# Patient Record
Sex: Male | Born: 1980
Health system: Southern US, Community
[De-identification: ages and names within clinical notes are randomized; demographics above are authoritative.]

## PROBLEM LIST (undated history)

## (undated) DIAGNOSIS — K50812 Crohn's disease of both small and large intestine with intestinal obstruction: Principal | ICD-10-CM

## (undated) DIAGNOSIS — K219 Gastro-esophageal reflux disease without esophagitis: Secondary | ICD-10-CM

## (undated) DIAGNOSIS — Z87442 Personal history of urinary calculi: Secondary | ICD-10-CM

## (undated) HISTORY — DX: Crohn's disease of both small and large intestine with intestinal obstruction: K50.812

## (undated) HISTORY — PX: HERNIA REPAIR: SHX51

---

## 2012-03-21 DIAGNOSIS — K50812 Crohn's disease of both small and large intestine with intestinal obstruction: Secondary | ICD-10-CM

## 2012-03-21 HISTORY — DX: Crohn's disease of both small and large intestine with intestinal obstruction: K50.812

## 2012-03-21 HISTORY — PX: SMALL INTESTINE SURGERY: SHX150

## 2012-04-04 DIAGNOSIS — K5090 Crohn's disease, unspecified, without complications: Secondary | ICD-10-CM | POA: Insufficient documentation

## 2012-04-04 DIAGNOSIS — K56609 Unspecified intestinal obstruction, unspecified as to partial versus complete obstruction: Secondary | ICD-10-CM | POA: Insufficient documentation

## 2012-04-09 DIAGNOSIS — Z9889 Other specified postprocedural states: Secondary | ICD-10-CM | POA: Insufficient documentation

## 2012-04-09 DIAGNOSIS — K6389 Other specified diseases of intestine: Secondary | ICD-10-CM | POA: Insufficient documentation

## 2012-04-09 DIAGNOSIS — K559 Vascular disorder of intestine, unspecified: Secondary | ICD-10-CM | POA: Insufficient documentation

## 2012-04-09 DIAGNOSIS — Z9049 Acquired absence of other specified parts of digestive tract: Secondary | ICD-10-CM | POA: Insufficient documentation

## 2012-04-22 DIAGNOSIS — S31109A Unspecified open wound of abdominal wall, unspecified quadrant without penetration into peritoneal cavity, initial encounter: Secondary | ICD-10-CM | POA: Insufficient documentation

## 2012-04-22 DIAGNOSIS — Z789 Other specified health status: Secondary | ICD-10-CM | POA: Insufficient documentation

## 2012-04-22 DIAGNOSIS — Z934 Other artificial openings of gastrointestinal tract status: Secondary | ICD-10-CM | POA: Insufficient documentation

## 2012-05-09 DIAGNOSIS — K7689 Other specified diseases of liver: Secondary | ICD-10-CM | POA: Insufficient documentation

## 2012-05-10 DIAGNOSIS — K7200 Acute and subacute hepatic failure without coma: Secondary | ICD-10-CM | POA: Insufficient documentation

## 2012-05-16 DIAGNOSIS — Z432 Encounter for attention to ileostomy: Secondary | ICD-10-CM | POA: Insufficient documentation

## 2012-06-07 ENCOUNTER — Encounter: Payer: Self-pay | Admitting: Internal Medicine

## 2012-06-07 ENCOUNTER — Ambulatory Visit (INDEPENDENT_AMBULATORY_CARE_PROVIDER_SITE_OTHER): Payer: BC Managed Care – PPO | Admitting: Internal Medicine

## 2012-06-07 VITALS — BP 120/68 | HR 88 | Temp 98.3°F | Resp 16 | Ht 70.0 in | Wt 135.0 lb

## 2012-06-07 DIAGNOSIS — K50812 Crohn's disease of both small and large intestine with intestinal obstruction: Secondary | ICD-10-CM

## 2012-06-07 DIAGNOSIS — K5080 Crohn's disease of both small and large intestine without complications: Secondary | ICD-10-CM

## 2012-06-07 NOTE — Patient Instructions (Signed)
Crohn's Disease Crohn's disease is a long-term (chronic) soreness and redness (inflammation) of the intestines (bowel). It can affect any portion of the digestive tract, from the mouth to the anus. It can also cause problems outside the digestive tract. Crohn's disease is closely related to a disease called ulcerative colitis (together, these two diseases are called inflammatory bowel disease).  CAUSES  The cause of Crohn's disease is not known. One Link Snuffer is that, in an easily affected (susceptible) person, the immune system is triggered to attack the body's own digestive tissue. Crohn's disease runs in families. It seems to be more common in certain geographic areas and amongst certain races. There are no clear-cut dietary causes.  SYMPTOMS  Crohn's disease can cause many different symptoms since it can affect many different parts of the body. Symptoms include:  Fatigue.  Weight loss.  Chronic diarrhea, sometime bloody.  Abdominal pain and cramps.  Fever.  Ulcers or canker sores in the mouth or rectum.  Anemia (low red blood cells).  Arthritis, skin problems, and eye problems may occur. Complications of Crohn's disease can include:  Series of holes (perforation) of the bowel.  Portions of the intestines sticking to each other (adhesions).  Obstruction of the bowel.  Fistula formation, typically in the rectal area but also in other areas. A fistula is an opening between the bowels and the outside, or between the bowels and another organ.  A painful crack in the mucous membrane of the anus (rectal fissure). DIAGNOSIS  Your caregiver may suspect Crohn's disease based on your symptoms and an exam. Blood tests may confirm that there is a problem. You may be asked to submit a stool specimen for examination. X-rays and CT scans may be necessary. Ultimately, the diagnosis is usually made after a procedure that uses a flexible tube that is inserted via your mouth or your anus. This is done  under sedation and is called either an upper endoscopy or colonoscopy. With these tests, the specialist can take tiny tissue samples and remove them from the inside of the bowel (biopsy). Examination of this biopsy tissue under a microscope can reveal Crohn's disease as the cause of your symptoms. Due to the many different forms that Crohn's disease can take, symptoms may be present for several years before a diagnosis is made. HOME CARE INSTRUCTIONS   There is no cure for Crohn's disease. The best treatment is frequent checkups with your caregiver.  Symptoms such as diarrhea can be controlled with medications. Avoid foods that have a laxative effect such as fresh fruit, vegetables and dairy products. During flare ups, you can rest your bowel by refraining from solid foods. Drink clear liquids frequently during the day (electrolyte or re-hydrating fluids are best. Your caregiver can help you with suggestions). Drink often to prevent loss of body fluids (dehydration). When diarrhea has cleared, eat small meals and more frequently. Avoid food additives and stimulants such as caffeine (coffee, tea, or chocolate). Enzyme supplements may help if you develop intolerance to a sugar in dairy products (lactose). Ask your caregiver or dietitian about specific dietary instructions.  Try to maintain a positive attitude. Learn relaxation techniques such as self hypnosis, mental imaging, and muscle relaxation.  If possible, avoid stresses which can aggravate your condition.  Exercise regularly.  Follow your diet.  Always get plenty of rest. SEEK MEDICAL CARE IF:   Your symptoms fail to improve after a week or two of new treatment.  You experience continued weight loss.  You have  ongoing crampy digestion or loose bowels.  You develop a new skin rash, skin sores, or eye problems. SEEK IMMEDIATE MEDICAL CARE IF:   You have worsening of your symptoms or develop new symptoms.  You have a fever.  You  develop bloody diarrhea.  You develop severe abdominal pain. MAKE SURE YOU:   Understand these instructions.  Will watch your condition.  Will get help right away if you are not doing well or get worse. Document Released: 12/15/2004 Document Revised: 05/30/2011 Document Reviewed: 11/13/2006 [facility] Patient Information 2013 [employer].

## 2012-06-08 ENCOUNTER — Telehealth: Payer: Self-pay

## 2012-06-08 ENCOUNTER — Encounter: Payer: Self-pay | Admitting: Internal Medicine

## 2012-06-08 DIAGNOSIS — K50812 Crohn's disease of both small and large intestine with intestinal obstruction: Secondary | ICD-10-CM | POA: Insufficient documentation

## 2012-06-08 NOTE — Telephone Encounter (Signed)
Returned call to Cecille Rubin //[facility] advising ok per MD

## 2012-06-08 NOTE — Assessment & Plan Note (Signed)
I will sign off on IVF and TPN orders and will review his labs as needed

## 2012-06-08 NOTE — Telephone Encounter (Signed)
yes

## 2012-06-08 NOTE — Progress Notes (Signed)
  Subjective:    Patient ID: Jeffrey Reilly, male    DOB: May 19, 1980, 32 y.o.   MRN: [id number]  HPI  New to me. He needs a PCP to sign off on TPN and IVF fluid orders from a pharmacy ([facility]) in [place]. He had a large portion of sm bowel removed 2 months ago in [place] after he suddenly developed an obstruction from crohn's. He now has an ileostomy bag, LUE catheter, and catheter in right lower abd. He feels like he is improving and has no new or worsening complaints today.  Review of Systems  Constitutional: Positive for fatigue. Negative for fever, chills, diaphoresis, activity change, appetite change and unexpected weight change.  HENT: Negative.   Eyes: Negative.   Respiratory: Negative.  Negative for cough, chest tightness, shortness of breath, wheezing and stridor.   Cardiovascular: Negative.   Gastrointestinal: Positive for diarrhea. Negative for nausea, vomiting, abdominal pain, constipation, blood in stool, abdominal distention and rectal pain.  Endocrine: Negative.   Genitourinary: Negative.   Musculoskeletal: Negative for myalgias, back pain, joint swelling, arthralgias and gait problem.  Allergic/Immunologic: Negative.   Neurological: Negative.  Negative for dizziness, tremors, seizures, weakness, light-headedness and numbness.  Hematological: Negative for adenopathy. Does not bruise/bleed easily.  Psychiatric/Behavioral: Negative.        Objective:   Physical Exam  Vitals reviewed. Constitutional: He is oriented to person, place, and time. Vital signs are normal. He appears well-developed and well-nourished.  Non-toxic appearance. He has a sickly appearance. He appears ill (thin, frail). No distress.  HENT:  Head: Normocephalic and atraumatic.  Mouth/Throat: Oropharynx is clear and moist. No oropharyngeal exudate.  Eyes: Conjunctivae are normal. Right eye exhibits no discharge. Left eye exhibits no discharge. No scleral icterus.  Neck: Normal range of motion. Neck supple. No JVD  present. No tracheal deviation present. No thyromegaly present.  Cardiovascular: Normal rate, regular rhythm, normal heart sounds and intact distal pulses.  Exam reveals no gallop and no friction rub.   No murmur heard. Pulmonary/Chest: Effort normal and breath sounds normal. No stridor. No respiratory distress. He has no wheezes. He has no rales. He exhibits no tenderness.  Abdominal: Soft. Normal appearance and bowel sounds are normal. He exhibits no shifting dullness, no distension, no pulsatile liver, no fluid wave, no abdominal bruit, no ascites, no pulsatile midline mass and no mass. There is no hepatosplenomegaly. There is no tenderness. There is no rebound, no guarding and no CVA tenderness. No hernia. Hernia confirmed negative in the ventral area.    Musculoskeletal: Normal range of motion. He exhibits no edema and no tenderness.       Left elbow: He exhibits deformity. He exhibits normal range of motion, no swelling, no effusion and no laceration. No tenderness found.       Arms: Lymphadenopathy:    He has no cervical adenopathy.  Neurological: He is oriented to person, place, and time.  Skin: Skin is warm and dry. No rash noted. He is not diaphoretic. No erythema. No pallor.  Psychiatric: He has a normal mood and affect. His behavior is normal. Judgment and thought content normal.          Assessment & Plan:

## 2012-06-08 NOTE — Telephone Encounter (Signed)
Cecille Rubin with [facility] called confirming that faxed orders were received. She also states that pt has additional order for 1 liter of fluid daily to avoid dehydration. She would like to know if MD would continue those orders. Thanks

## 2012-06-13 ENCOUNTER — Telehealth: Payer: Self-pay

## 2012-06-13 NOTE — Telephone Encounter (Signed)
Yes, that is fine. 

## 2012-06-13 NOTE — Telephone Encounter (Signed)
Levada Dy called [facility] stating that she and the patient discussed "the removal of famotidine from his TPN". They feel that he will be ok as far as acid reflux due to taking protonix but wanted to see how Dr. Ronnald Ramp felt about this. Please advise Thanks

## 2012-06-14 NOTE — Telephone Encounter (Signed)
Levada Dy notified per MD

## 2012-06-18 ENCOUNTER — Telehealth: Payer: Self-pay | Admitting: Internal Medicine

## 2012-06-18 NOTE — Telephone Encounter (Signed)
Rec'd from [facility] forward 73 pages to [place]

## 2012-06-29 ENCOUNTER — Encounter: Payer: Self-pay | Admitting: Internal Medicine

## 2012-07-02 ENCOUNTER — Other Ambulatory Visit: Payer: Self-pay | Admitting: Internal Medicine

## 2012-07-06 ENCOUNTER — Telehealth: Payer: Self-pay | Admitting: Internal Medicine

## 2012-07-06 DIAGNOSIS — R739 Hyperglycemia, unspecified: Secondary | ICD-10-CM

## 2012-07-06 MED ORDER — INSULIN REGULAR HUMAN 100 UNIT/ML IJ SOLN: INTRAMUSCULAR | Status: DC

## 2012-07-06 NOTE — Telephone Encounter (Signed)
Patient has question regarding Insulin and advised that he may need RX.  Please call him back at [phone].

## 2012-07-06 NOTE — Telephone Encounter (Signed)
Pt is currently taking Novolin R on sliding scale for non-fasting sugar above 150-200 2 units 201-250 4 units etc. Rx was prescribed by [previous PCP but medication is expired, new Rx approved bt [facility], pt advised of same.

## 2012-07-09 ENCOUNTER — Telehealth: Payer: Self-pay

## 2012-07-09 NOTE — Telephone Encounter (Signed)
yes

## 2012-07-09 NOTE — Telephone Encounter (Signed)
Levada Dy with [facility] [phone] called requesting to change pt's daily IV fluid to 500 ml from 1000 ml and to Carnitine to pt's TPN. Please advise.

## 2012-07-10 NOTE — Telephone Encounter (Signed)
[place] notified.

## 2012-07-23 ENCOUNTER — Telehealth: Payer: Self-pay | Admitting: *Deleted

## 2012-07-23 ENCOUNTER — Other Ambulatory Visit: Payer: Self-pay | Admitting: *Deleted

## 2012-07-23 MED ORDER — LOPERAMIDE HCL 2 MG PO CAPS: 2.0000 mg | ORAL_CAPSULE | Freq: Three times a day (TID) | ORAL | Status: DC

## 2012-07-23 MED ORDER — PANTOPRAZOLE SODIUM 40 MG PO TBEC: 40.0000 mg | DELAYED_RELEASE_TABLET | Freq: Every day | ORAL | Status: DC

## 2012-07-23 NOTE — Telephone Encounter (Signed)
Pt left vm req Rf on Pantoprazole and Loperamide. Rfs sent. Pt informed

## 2012-07-25 ENCOUNTER — Telehealth: Payer: Self-pay

## 2012-07-25 NOTE — Telephone Encounter (Signed)
yes

## 2012-07-25 NOTE — Telephone Encounter (Signed)
Levada Dy given verbal ok per MD

## 2012-07-25 NOTE — Telephone Encounter (Signed)
Levada Dy with [facility] called stating that pt has gained 24lb over the last six weeks. They would like to know if MD would agree to decrease dextrose to 202 grams, lipids to 40 grams and change infusion time from 12 to 10 hours. Please advise Thanks

## 2012-07-25 NOTE — Telephone Encounter (Signed)
Opened in error

## 2012-08-08 ENCOUNTER — Encounter: Payer: Self-pay | Admitting: Internal Medicine

## 2012-08-09 ENCOUNTER — Encounter: Payer: Self-pay | Admitting: Internal Medicine

## 2012-08-09 ENCOUNTER — Ambulatory Visit (INDEPENDENT_AMBULATORY_CARE_PROVIDER_SITE_OTHER): Payer: BC Managed Care – PPO | Admitting: Internal Medicine

## 2012-08-09 VITALS — BP 120/76 | HR 75 | Temp 98.7°F | Resp 16 | Ht 70.0 in | Wt 153.2 lb

## 2012-08-09 DIAGNOSIS — K5080 Crohn's disease of both small and large intestine without complications: Secondary | ICD-10-CM

## 2012-08-09 DIAGNOSIS — K50812 Crohn's disease of both small and large intestine with intestinal obstruction: Secondary | ICD-10-CM

## 2012-08-12 ENCOUNTER — Encounter: Payer: Self-pay | Admitting: Internal Medicine

## 2012-08-12 NOTE — Assessment & Plan Note (Signed)
Improvement noted I await the results of his recent EGD and upcoming colonoscopy

## 2012-08-12 NOTE — Patient Instructions (Addendum)
Crohn's Disease Crohn's disease is a long-term (chronic) soreness and redness (inflammation) of the intestines (bowel). It can affect any portion of the digestive tract, from the mouth to the anus. It can also cause problems outside the digestive tract. Crohn's disease is closely related to a disease called ulcerative colitis (together, these two diseases are called inflammatory bowel disease).  CAUSES  The cause of Crohn's disease is not known. One Link Snuffer is that, in an easily affected (susceptible) person, the immune system is triggered to attack the body's own digestive tissue. Crohn's disease runs in families. It seems to be more common in certain geographic areas and amongst certain races. There are no clear-cut dietary causes.  SYMPTOMS  Crohn's disease can cause many different symptoms since it can affect many different parts of the body. Symptoms include:  Fatigue.  Weight loss.  Chronic diarrhea, sometime bloody.  Abdominal pain and cramps.  Fever.  Ulcers or canker sores in the mouth or rectum.  Anemia (low red blood cells).  Arthritis, skin problems, and eye problems may occur. Complications of Crohn's disease can include:  Series of holes (perforation) of the bowel.  Portions of the intestines sticking to each other (adhesions).  Obstruction of the bowel.  Fistula formation, typically in the rectal area but also in other areas. A fistula is an opening between the bowels and the outside, or between the bowels and another organ.  A painful crack in the mucous membrane of the anus (rectal fissure). DIAGNOSIS  Your caregiver may suspect Crohn's disease based on your symptoms and an exam. Blood tests may confirm that there is a problem. You may be asked to submit a stool specimen for examination. X-rays and CT scans may be necessary. Ultimately, the diagnosis is usually made after a procedure that uses a flexible tube that is inserted via your mouth or your anus. This is done  under sedation and is called either an upper endoscopy or colonoscopy. With these tests, the specialist can take tiny tissue samples and remove them from the inside of the bowel (biopsy). Examination of this biopsy tissue under a microscope can reveal Crohn's disease as the cause of your symptoms. Due to the many different forms that Crohn's disease can take, symptoms may be present for several years before a diagnosis is made. HOME CARE INSTRUCTIONS   There is no cure for Crohn's disease. The best treatment is frequent checkups with your caregiver.  Symptoms such as diarrhea can be controlled with medications. Avoid foods that have a laxative effect such as fresh fruit, vegetables and dairy products. During flare ups, you can rest your bowel by refraining from solid foods. Drink clear liquids frequently during the day (electrolyte or re-hydrating fluids are best. Your caregiver can help you with suggestions). Drink often to prevent loss of body fluids (dehydration). When diarrhea has cleared, eat small meals and more frequently. Avoid food additives and stimulants such as caffeine (coffee, tea, or chocolate). Enzyme supplements may help if you develop intolerance to a sugar in dairy products (lactose). Ask your caregiver or dietitian about specific dietary instructions.  Try to maintain a positive attitude. Learn relaxation techniques such as self hypnosis, mental imaging, and muscle relaxation.  If possible, avoid stresses which can aggravate your condition.  Exercise regularly.  Follow your diet.  Always get plenty of rest. SEEK MEDICAL CARE IF:   Your symptoms fail to improve after a week or two of new treatment.  You experience continued weight loss.  You have  ongoing crampy digestion or loose bowels.  You develop a new skin rash, skin sores, or eye problems. SEEK IMMEDIATE MEDICAL CARE IF:   You have worsening of your symptoms or develop new symptoms.  You have a fever.  You  develop bloody diarrhea.  You develop severe abdominal pain. MAKE SURE YOU:   Understand these instructions.  Will watch your condition.  Will get help right away if you are not doing well or get worse. Document Released: 12/15/2004 Document Revised: 05/30/2011 Document Reviewed: 11/13/2006 [facility] Patient Information 2014 [place], [place].

## 2012-08-12 NOTE — Progress Notes (Signed)
  Subjective:    Patient ID: Jeffrey Reilly, male    DOB: Dec 20, 1980, 32 y.o.   MRN: [id number]  HPI  He returns for f/up and tells me that his doing much better, he has had a good appetite and has gained some weight. He goes in for a colonoscopy at [facility] tomorrow and he plans to have GI revision surgery in the next few months.  Review of Systems  Constitutional: Negative.  Negative for fever, chills, diaphoresis, activity change, fatigue and unexpected weight change.  HENT: Negative.   Eyes: Negative.   Respiratory: Negative.   Cardiovascular: Negative.   Gastrointestinal: Negative.  Negative for nausea, vomiting, abdominal pain and constipation.  Endocrine: Negative.   Genitourinary: Negative.   Musculoskeletal: Negative.   Skin: Negative.   Allergic/Immunologic: Negative.   Neurological: Negative for dizziness.  Hematological: Negative for adenopathy. Does not bruise/bleed easily.  Psychiatric/Behavioral: Negative.        Objective:   Physical Exam  Vitals reviewed. Constitutional: He is oriented to person, place, and time. He appears well-developed and well-nourished.  Non-toxic appearance. He does not have a sickly appearance. He does not appear ill. No distress.  HENT:  Head: Normocephalic and atraumatic.  Mouth/Throat: Oropharynx is clear and moist. No oropharyngeal exudate.  Eyes: Conjunctivae are normal. Right eye exhibits no discharge. Left eye exhibits no discharge. No scleral icterus.  Neck: Normal range of motion. Neck supple. No JVD present. No tracheal deviation present. No thyromegaly present.  Cardiovascular: Normal rate, regular rhythm, normal heart sounds and intact distal pulses.  Exam reveals no gallop and no friction rub.   No murmur heard. Pulmonary/Chest: Effort normal and breath sounds normal. No stridor. No respiratory distress. He has no wheezes. He has no rales. He exhibits no tenderness.  Abdominal: Soft. Bowel sounds are normal. He exhibits no distension  and no mass. There is no hepatosplenomegaly, splenomegaly or hepatomegaly. There is no tenderness. There is no rebound, no guarding and no CVA tenderness. No hernia. Hernia confirmed negative in the ventral area.    Musculoskeletal: Normal range of motion. He exhibits no edema and no tenderness.  Lymphadenopathy:    He has no cervical adenopathy.  Neurological: He is oriented to person, place, and time.  Skin: Skin is warm and dry. No rash noted. He is not diaphoretic. No erythema. No pallor.  Psychiatric: He has a normal mood and affect. His behavior is normal. Judgment and thought content normal.          Assessment & Plan:

## 2012-08-16 ENCOUNTER — Telehealth: Payer: Self-pay | Admitting: *Deleted

## 2012-08-16 NOTE — Telephone Encounter (Signed)
Left msg on triage requesting call bck with Dr. Ronnald Ramp fax #. Dr. Saint Lucia would like to send Dr. Ronnald Ramp updated information on mutual patient. Called Christine back gave fax #...Jeffrey Reilly

## 2012-08-20 ENCOUNTER — Telehealth: Payer: Self-pay | Admitting: *Deleted

## 2012-08-20 NOTE — Telephone Encounter (Signed)
Pt was seen at [facility] last week and they were suppose to send orders for lab work to PCP. Pt wants to know if those orders were received by [place] and if those orders were faxed to [facility] to have them draw.

## 2012-08-20 NOTE — Telephone Encounter (Signed)
It must have been sent to [facility]

## 2012-08-21 NOTE — Telephone Encounter (Signed)
Left message informing pt of MD's response and to callback office with any questions/concerns.

## 2012-08-23 ENCOUNTER — Telehealth: Payer: Self-pay

## 2012-08-23 DIAGNOSIS — K912 Postsurgical malabsorption, not elsewhere classified: Secondary | ICD-10-CM

## 2012-08-23 NOTE — Telephone Encounter (Signed)
Called [facility], spoke with Levada Dy and gave verbal orders as well as faxed written orders for labs and TPN adjustments to [phone].

## 2012-08-23 NOTE — Telephone Encounter (Signed)
Jeffrey Reilly notified,no receipt of lab request and TPN adjustments. Dr. Saint Reilly is recommending a nutritional panel that includes; vit d 25, copper, folate, ferritin, Iron, TIBC, Selinium, Zinc, vit b6, vit b12, vitamins C,E and A (dx: 579.3).  He also recommends the following " decrease one day per week of TPN and give 1 liter of saline on the day he does not infuse TPN to avoid dehydration. In addition, in light of his excellent hydration status, I believe he could skip one day of his IV hydration 3-4 days separated from the day he skips the TPN."  Orders should be sent to patient home health company. Thanks

## 2012-08-23 NOTE — Telephone Encounter (Signed)
Please send these requests to [facility]

## 2012-08-23 NOTE — Telephone Encounter (Signed)
Kristen w/ Dr. Saint Lucia office at [facility] called [facility] stating that office note from his last visit with Dr. Saint Lucia was faxed over to PCP. The correspondance included his recommendations for TPN adjustment as well as recommended lab orders. Jeffrey Reilly would like to know if MD has received this yet due to no order request received by home health yet.

## 2012-09-03 ENCOUNTER — Encounter: Payer: Self-pay | Admitting: Internal Medicine

## 2012-09-10 ENCOUNTER — Other Ambulatory Visit: Payer: Self-pay | Admitting: *Deleted

## 2012-09-10 NOTE — Telephone Encounter (Signed)
Patient says that he is taking more than written, He is taking 2 tablets three times daily. If you would like him to continue this dose we will need a new script. Thanks!

## 2012-09-11 ENCOUNTER — Telehealth: Payer: Self-pay | Admitting: *Deleted

## 2012-09-11 DIAGNOSIS — K50812 Crohn's disease of both small and large intestine with intestinal obstruction: Secondary | ICD-10-CM

## 2012-09-11 MED ORDER — PANTOPRAZOLE SODIUM 40 MG PO TBEC: 40.0000 mg | DELAYED_RELEASE_TABLET | Freq: Every day | ORAL | Status: DC

## 2012-09-11 MED ORDER — LOPERAMIDE HCL 2 MG PO CAPS: 2.0000 mg | ORAL_CAPSULE | Freq: Three times a day (TID) | ORAL | Status: DC

## 2012-09-11 NOTE — Telephone Encounter (Signed)
Called pharmacy, patient told pharmacy that his pantoprazole has changed to 1 tab 2 x daily and the loperamide has changed to 2 cap 3 x daily. Please advise?

## 2012-09-11 NOTE — Telephone Encounter (Signed)
Duplicate

## 2012-09-11 NOTE — Telephone Encounter (Signed)
Sarah from [facility] called requesting clarification on medications.  Pt states he is to take Pantoprazole 74m 1 tab BID, Loperamide 214mTID.  Rx was not written that way. Please advise SaJudson Rocht [phone]

## 2012-09-12 ENCOUNTER — Telehealth: Payer: Self-pay

## 2012-09-12 DIAGNOSIS — K50812 Crohn's disease of both small and large intestine with intestinal obstruction: Secondary | ICD-10-CM

## 2012-09-12 MED ORDER — PANTOPRAZOLE SODIUM 40 MG PO TBEC: 40.0000 mg | DELAYED_RELEASE_TABLET | Freq: Two times a day (BID) | ORAL | Status: DC

## 2012-09-12 MED ORDER — DIPHENOXYLATE-ATROPINE 2.5-0.025 MG PO TABS: 1.0000 | ORAL_TABLET | Freq: Three times a day (TID) | ORAL | Status: DC | PRN

## 2012-09-12 NOTE — Telephone Encounter (Signed)
I changed it

## 2012-09-12 NOTE — Telephone Encounter (Signed)
Rx changed by Dr Ronnald Ramp.

## 2012-09-12 NOTE — Telephone Encounter (Signed)
Levada Dy, RN from [facility] called requesting the below orders from Dr. Ronnald Ramp. I advised that per MD, those orders will need to come directly from Dr. Saint Lucia office. She states "that they cannot accept orders for another MD who does not sign them, and Dr. Ronnald Ramp has been prescribing previous orders". I will hold note to see if Cyril Mourning calls back

## 2012-09-12 NOTE — Telephone Encounter (Signed)
Received fax from Berton Mount, RN with [facility] stating that Dr. Saint Lucia is recommending a decrease in current TPN orders. Per Dr. Ronnald Ramp advisement, Dr. Saint Lucia will need to contact [facility] directly for the request. Kristen notified via phone message at [phone] and fax at [phone] to advise for their office to contact [facility] directly

## 2012-09-12 NOTE — Telephone Encounter (Signed)
Pt called states he takes Loperamide 31m two tablets TID btu Rx was written to pharmacy for Loperamide 27mone tablet TID.  Please advsie of increase.

## 2012-09-13 ENCOUNTER — Telehealth: Payer: Self-pay

## 2012-09-13 MED ORDER — DIPHENOXYLATE-ATROPINE 2.5-0.025 MG PO TABS: 2.0000 | ORAL_TABLET | Freq: Three times a day (TID) | ORAL | Status: DC | PRN

## 2012-09-13 NOTE — Addendum Note (Signed)
Addended by: Janith Lima on: 09/13/2012 07:25 AM   Modules accepted: Orders

## 2012-09-13 NOTE — Telephone Encounter (Signed)
Levada Dy RN, with [facility] called back this morning requesting to know if MD would be ok with the following since the ordering physician has to also be the signing physician. She will take Dr. Demetra Shiner orders verbally, have a standing verbal order on file from Dr. Ronnald Ramp to

## 2012-09-13 NOTE — Telephone Encounter (Signed)
Levada Dy RN, with [facility] called back this morning requesting to know if MD would be ok with the following since the ordering physician has to also be the signing physician. She will take Dr. Demetra Shiner orders verbally, have a standing verbal order on file from Dr. Ronnald Ramp approving Dr. Saint Lucia recommendations to avoid so many phone calls. She will then fax over the written orders to be signed by Dr. Ronnald Ramp. Please advise if you are in agreement with this. Thanks

## 2012-09-13 NOTE — Telephone Encounter (Signed)
Victoriano Lain at [facility]

## 2012-09-13 NOTE — Telephone Encounter (Signed)
ok 

## 2012-09-14 ENCOUNTER — Encounter: Payer: Self-pay | Admitting: Internal Medicine

## 2012-09-18 ENCOUNTER — Telehealth: Payer: Self-pay | Admitting: *Deleted

## 2012-09-18 ENCOUNTER — Encounter: Payer: Self-pay | Admitting: Internal Medicine

## 2012-09-18 NOTE — Telephone Encounter (Signed)
Rx has been changed as per Dr Ronnald Ramp.

## 2012-09-18 NOTE — Telephone Encounter (Signed)
Pt called requesting an increase of his Loperamide.  He states he takes 2 (54m) tabs TID but his Rx is written for 1(228m tab TID.  Please advise of increase.

## 2012-09-18 NOTE — Telephone Encounter (Signed)
This has been changed

## 2012-09-27 ENCOUNTER — Encounter: Payer: Self-pay | Admitting: Internal Medicine

## 2012-10-08 ENCOUNTER — Encounter: Payer: Self-pay | Admitting: Internal Medicine

## 2012-10-09 ENCOUNTER — Encounter: Payer: Self-pay | Admitting: Internal Medicine

## 2012-10-16 ENCOUNTER — Telehealth: Payer: Self-pay | Admitting: Internal Medicine

## 2012-10-16 NOTE — Telephone Encounter (Signed)
rec'd records from [facility], Forward 4 pages to Dr.Jones

## 2012-10-24 ENCOUNTER — Encounter: Payer: Self-pay | Admitting: Internal Medicine

## 2012-12-12 ENCOUNTER — Encounter: Payer: Self-pay | Admitting: Internal Medicine

## 2012-12-18 ENCOUNTER — Telehealth: Payer: Self-pay | Admitting: *Deleted

## 2012-12-18 DIAGNOSIS — K50812 Crohn's disease of both small and large intestine with intestinal obstruction: Secondary | ICD-10-CM

## 2012-12-18 NOTE — Telephone Encounter (Signed)
Pt called requesting a referral to a local GI MD so he may get an order for Remigade treatments.  Please advise

## 2012-12-18 NOTE — Telephone Encounter (Signed)
ok 

## 2012-12-19 NOTE — Telephone Encounter (Signed)
Spoke with pt advised referral complete

## 2013-01-08 ENCOUNTER — Ambulatory Visit (INDEPENDENT_AMBULATORY_CARE_PROVIDER_SITE_OTHER): Payer: BC Managed Care – PPO | Admitting: Internal Medicine

## 2013-01-08 ENCOUNTER — Encounter: Payer: Self-pay | Admitting: Internal Medicine

## 2013-01-08 VITALS — BP 140/86 | HR 80 | Temp 98.5°F | Resp 16 | Ht 70.0 in | Wt 148.0 lb

## 2013-01-08 DIAGNOSIS — Z79899 Other long term (current) drug therapy: Secondary | ICD-10-CM

## 2013-01-08 DIAGNOSIS — Z79620 Long term (current) use of immunosuppressive biologic: Secondary | ICD-10-CM | POA: Insufficient documentation

## 2013-01-08 DIAGNOSIS — K5080 Crohn's disease of both small and large intestine without complications: Secondary | ICD-10-CM

## 2013-01-08 DIAGNOSIS — K50812 Crohn's disease of both small and large intestine with intestinal obstruction: Secondary | ICD-10-CM

## 2013-01-08 DIAGNOSIS — Z23 Encounter for immunization: Secondary | ICD-10-CM | POA: Insufficient documentation

## 2013-01-08 MED ORDER — INFLIXIMAB 100 MG IV SOLR: 5.0000 mg/kg | INTRAVENOUS | Status: DC

## 2013-01-08 NOTE — Assessment & Plan Note (Addendum)
Will place a PPD today At the request of his GI at [facility] he will start remicade (66m/kg at time 0, 2 weeks, 6 weeks, then every 8 weeks) He will cont TPN

## 2013-01-08 NOTE — Patient Instructions (Signed)
Crohn's Disease Crohn's disease is a long-term (chronic) soreness and redness (inflammation) of the intestines (bowel). It can affect any portion of the digestive tract, from the mouth to the anus. It can also cause problems outside the digestive tract. Crohn's disease is closely related to a disease called ulcerative colitis (together, these two diseases are called inflammatory bowel disease).  CAUSES  The cause of Crohn's disease is not known. One Link Snuffer is that, in an easily affected (susceptible) person, the immune system is triggered to attack the body's own digestive tissue. Crohn's disease runs in families. It seems to be more common in certain geographic areas and amongst certain races. There are no clear-cut dietary causes.  SYMPTOMS  Crohn's disease can cause many different symptoms since it can affect many different parts of the body. Symptoms include:  Fatigue.  Weight loss.  Chronic diarrhea, sometime bloody.  Abdominal pain and cramps.  Fever.  Ulcers or canker sores in the mouth or rectum.  Anemia (low red blood cells).  Arthritis, skin problems, and eye problems may occur. Complications of Crohn's disease can include:  Series of holes (perforation) of the bowel.  Portions of the intestines sticking to each other (adhesions).  Obstruction of the bowel.  Fistula formation, typically in the rectal area but also in other areas. A fistula is an opening between the bowels and the outside, or between the bowels and another organ.  A painful crack in the mucous membrane of the anus (rectal fissure). DIAGNOSIS  Your caregiver may suspect Crohn's disease based on your symptoms and an exam. Blood tests may confirm that there is a problem. You may be asked to submit a stool specimen for examination. X-rays and CT scans may be necessary. Ultimately, the diagnosis is usually made after a procedure that uses a flexible tube that is inserted via your mouth or your anus. This is done  under sedation and is called either an upper endoscopy or colonoscopy. With these tests, the specialist can take tiny tissue samples and remove them from the inside of the bowel (biopsy). Examination of this biopsy tissue under a microscope can reveal Crohn's disease as the cause of your symptoms. Due to the many different forms that Crohn's disease can take, symptoms may be present for several years before a diagnosis is made. HOME CARE INSTRUCTIONS   There is no cure for Crohn's disease. The best treatment is frequent checkups with your caregiver.  Symptoms such as diarrhea can be controlled with medications. Avoid foods that have a laxative effect such as fresh fruit, vegetables and dairy products. During flare ups, you can rest your bowel by refraining from solid foods. Drink clear liquids frequently during the day (electrolyte or re-hydrating fluids are best. Your caregiver can help you with suggestions). Drink often to prevent loss of body fluids (dehydration). When diarrhea has cleared, eat small meals and more frequently. Avoid food additives and stimulants such as caffeine (coffee, tea, or chocolate). Enzyme supplements may help if you develop intolerance to a sugar in dairy products (lactose). Ask your caregiver or dietitian about specific dietary instructions.  Try to maintain a positive attitude. Learn relaxation techniques such as self hypnosis, mental imaging, and muscle relaxation.  If possible, avoid stresses which can aggravate your condition.  Exercise regularly.  Follow your diet.  Always get plenty of rest. SEEK MEDICAL CARE IF:   Your symptoms fail to improve after a week or two of new treatment.  You experience continued weight loss.  You have  ongoing crampy digestion or loose bowels.  You develop a new skin rash, skin sores, or eye problems. SEEK IMMEDIATE MEDICAL CARE IF:   You have worsening of your symptoms or develop new symptoms.  You have a fever.  You  develop bloody diarrhea.  You develop severe abdominal pain. MAKE SURE YOU:   Understand these instructions.  Will watch your condition.  Will get help right away if you are not doing well or get worse. Document Released: 12/15/2004 Document Revised: 05/30/2011 Document Reviewed: 11/13/2006 [facility] Patient Information 2014 [place], [place].

## 2013-01-08 NOTE — Progress Notes (Signed)
  Subjective:    Patient ID: Jeffrey Reilly, male    DOB: 12-07-1980, 32 y.o.   MRN: [id number]  HPI  He returns for f/up, he has had extensive surgery at [facility]. He is still on TPN 5 days per week but is eating. He has about 3 formed BM's per day and he is gaining weight. His GI doctor at [facility] has asked that we start remicade in infusion here. I did not have success with a local GI doctor being willing to see him.  Review of Systems  Constitutional: Positive for unexpected weight change (some weight gain). Negative for fever, chills, diaphoresis, appetite change and fatigue.  HENT: Negative.   Eyes: Negative.   Respiratory: Negative.  Negative for cough, chest tightness, shortness of breath, wheezing and stridor.   Cardiovascular: Negative.  Negative for chest pain, palpitations and leg swelling.  Gastrointestinal: Negative.  Negative for nausea, vomiting, abdominal pain, diarrhea and constipation.  Endocrine: Negative.   Genitourinary: Negative.  Negative for dysuria, frequency and difficulty urinating.  Musculoskeletal: Negative.   Skin: Negative.   Allergic/Immunologic: Negative.   Neurological: Negative.  Negative for dizziness, tremors, speech difficulty, weakness and light-headedness.  Hematological: Negative.  Negative for adenopathy. Does not bruise/bleed easily.  Psychiatric/Behavioral: Negative.        Objective:   Physical Exam  Vitals reviewed. Constitutional: He is oriented to person, place, and time. He appears well-developed and well-nourished. No distress.  HENT:  Head: Normocephalic and atraumatic.  Mouth/Throat: Oropharynx is clear and moist. No oropharyngeal exudate.  Eyes: Conjunctivae are normal. Right eye exhibits no discharge. Left eye exhibits no discharge. No scleral icterus.  Neck: Normal range of motion. Neck supple. No JVD present. No tracheal deviation present. No thyromegaly present.  Cardiovascular: Normal rate, regular rhythm, normal heart sounds and intact  distal pulses.  Exam reveals no gallop and no friction rub.   No murmur heard. Pulmonary/Chest: Effort normal and breath sounds normal. No stridor. No respiratory distress. He has no wheezes. He has no rales. He exhibits no tenderness.  Abdominal: Soft. Bowel sounds are normal. He exhibits no distension and no mass. There is no tenderness. There is no rebound and no guarding.  Musculoskeletal: Normal range of motion. He exhibits no edema and no tenderness.  Lymphadenopathy:    He has no cervical adenopathy.  Neurological: He is oriented to person, place, and time.  Skin: Skin is warm and dry. No rash noted. He is not diaphoretic. No erythema. No pallor.  Psychiatric: He has a normal mood and affect. His behavior is normal. Judgment and thought content normal.      No results found for this basename: WBC, HGB, HCT, PLT, GLUCOSE, CHOL, TRIG, HDL, LDLDIRECT, LDLCALC, ALT, AST, NA, K, CL, CREATININE, BUN, CO2, TSH, PSA, INR, GLUF, HGBA1C, MICROALBUR      Assessment & Plan:

## 2013-01-08 NOTE — Assessment & Plan Note (Signed)
I will check his PPD

## 2013-01-10 LAB — TB SKIN TEST
Induration: 0 mm
TB Skin Test: NEGATIVE

## 2013-01-14 ENCOUNTER — Other Ambulatory Visit (HOSPITAL_COMMUNITY): Payer: Self-pay | Admitting: *Deleted

## 2013-01-15 ENCOUNTER — Encounter (HOSPITAL_COMMUNITY)
Admission: RE | Admit: 2013-01-15 | Discharge: 2013-01-15 | Disposition: A | Discharge status: Home / Self Care | Payer: BC Managed Care – PPO | Source: Ambulatory Visit | Attending: Internal Medicine | Admitting: Internal Medicine

## 2013-01-15 ENCOUNTER — Telehealth: Payer: Self-pay

## 2013-01-15 DIAGNOSIS — K50812 Crohn's disease of both small and large intestine with intestinal obstruction: Secondary | ICD-10-CM

## 2013-01-15 DIAGNOSIS — K5080 Crohn's disease of both small and large intestine without complications: Secondary | ICD-10-CM | POA: Insufficient documentation

## 2013-01-15 MED ORDER — INFLIXIMAB 100 MG IV SOLR: INTRAVENOUS | Status: DC

## 2013-01-15 MED ORDER — HEPARIN SOD (PORK) LOCK FLUSH 100 UNIT/ML IV SOLN
250.0000 [IU] | INTRAVENOUS | Status: DC | PRN
  Administered 2013-01-15: 250 [IU]

## 2013-01-15 MED ORDER — SODIUM CHLORIDE 0.9 % IV SOLN
INTRAVENOUS | Status: DC
  Administered 2013-01-15: 10:00:00 via INTRAVENOUS

## 2013-01-15 MED ORDER — HEPARIN SOD (PORK) LOCK FLUSH 100 UNIT/ML IV SOLN
INTRAVENOUS | Status: AC
  Filled 2013-01-15: qty 5

## 2013-01-15 MED ORDER — SODIUM CHLORIDE 0.9 % IV SOLN
300.0000 mg | INTRAVENOUS | Status: DC
  Administered 2013-01-15: 300 mg via INTRAVENOUS
  Filled 2013-01-15: qty 30

## 2013-01-15 NOTE — Telephone Encounter (Signed)
Verbal orders given to Jeffrey Reilly for the following- Inject 300 mg at 0 wk, 2 wk, 6 wk, and 8 wk intervals  To be infused at [facility] for benadryl 50 mg and tylenol 500 mg prior to injection

## 2013-01-15 NOTE — Telephone Encounter (Signed)
Izora Gala from [facility] short stay called stating that pt is there for infusion today and would like to clarify his Remicade orders. Per Izora Gala, the MD at [facility] as well as the standard for this infusion is usually 2/2/6/8 wk intervals but the order that they received has every 14 days. She would just like to clarify which it should be? Thanks

## 2013-01-15 NOTE — Telephone Encounter (Signed)
Go by the [facility] order

## 2013-01-29 ENCOUNTER — Encounter (HOSPITAL_COMMUNITY)
Admission: RE | Admit: 2013-01-29 | Discharge: 2013-01-29 | Disposition: A | Discharge status: Home / Self Care | Payer: BC Managed Care – PPO | Source: Ambulatory Visit | Attending: Internal Medicine | Admitting: Internal Medicine

## 2013-01-29 DIAGNOSIS — K5080 Crohn's disease of both small and large intestine without complications: Secondary | ICD-10-CM | POA: Insufficient documentation

## 2013-01-29 MED ORDER — HEPARIN SOD (PORK) LOCK FLUSH 100 UNIT/ML IV SOLN
250.0000 [IU] | INTRAVENOUS | Status: DC | PRN
  Administered 2013-01-29: 250 [IU]

## 2013-01-29 MED ORDER — HEPARIN SOD (PORK) LOCK FLUSH 100 UNIT/ML IV SOLN
INTRAVENOUS | Status: AC
  Administered 2013-01-29: 250 [IU]
  Filled 2013-01-29: qty 5

## 2013-01-29 MED ORDER — SODIUM CHLORIDE 0.9 % IV SOLN
300.0000 mg | INTRAVENOUS | Status: DC
  Administered 2013-01-29: 300 mg via INTRAVENOUS
  Filled 2013-01-29: qty 30

## 2013-01-29 MED ORDER — SODIUM CHLORIDE 0.9 % IV SOLN
INTRAVENOUS | Status: DC
  Administered 2013-01-29: 10:00:00 via INTRAVENOUS

## 2013-01-29 MED ORDER — SODIUM CHLORIDE 0.9 % IV SOLN
300.0000 mg | INTRAVENOUS | Status: DC
  Filled 2013-01-29: qty 30

## 2013-02-13 DIAGNOSIS — Z7960 Long term (current) use of unspecified immunomodulators and immunosuppressants: Secondary | ICD-10-CM | POA: Insufficient documentation

## 2013-02-25 ENCOUNTER — Telehealth: Payer: Self-pay

## 2013-02-25 NOTE — Telephone Encounter (Signed)
Patient has been advised

## 2013-02-25 NOTE — Telephone Encounter (Signed)
Phone call from patient [phone] stating he has a remicade infusion tomorrow. He feels a cold starting. No fever but he does have head congestion. He was advised to give his pcp a call to see if okay to still have the infusion. Please advise, thanks

## 2013-02-25 NOTE — Telephone Encounter (Signed)
Yes, as long as he is not ill with fever, cough, muscle aches, etc.

## 2013-02-26 ENCOUNTER — Encounter (HOSPITAL_COMMUNITY)
Admission: RE | Admit: 2013-02-26 | Discharge: 2013-02-26 | Disposition: A | Discharge status: Home / Self Care | Payer: BC Managed Care – PPO | Source: Ambulatory Visit | Attending: Internal Medicine | Admitting: Internal Medicine

## 2013-02-26 DIAGNOSIS — K5080 Crohn's disease of both small and large intestine without complications: Secondary | ICD-10-CM | POA: Insufficient documentation

## 2013-02-26 MED ORDER — HEPARIN SOD (PORK) LOCK FLUSH 100 UNIT/ML IV SOLN
250.0000 [IU] | INTRAVENOUS | Status: AC | PRN
  Administered 2013-02-26: 250 [IU]

## 2013-02-26 MED ORDER — SODIUM CHLORIDE 0.9 % IV SOLN: 300.0000 mg | INTRAVENOUS | Status: DC

## 2013-02-26 MED ORDER — SODIUM CHLORIDE 0.9 % IV SOLN
300.0000 mg | Freq: Once | INTRAVENOUS | Status: AC
  Administered 2013-02-26: 300 mg via INTRAVENOUS
  Filled 2013-02-26: qty 30

## 2013-02-26 MED ORDER — SODIUM CHLORIDE 0.9 % IV SOLN
INTRAVENOUS | Status: DC
  Administered 2013-02-26: 11:00:00 via INTRAVENOUS

## 2013-02-26 MED ORDER — HEPARIN SOD (PORK) LOCK FLUSH 100 UNIT/ML IV SOLN
INTRAVENOUS | Status: AC
  Filled 2013-02-26: qty 5

## 2013-07-25 ENCOUNTER — Telehealth: Payer: Self-pay | Admitting: *Deleted

## 2013-07-25 NOTE — Telephone Encounter (Signed)
Unsure if this is a medication provided by the hospital for infusion or if pt personally picks up from the pharmacy.

## 2013-07-25 NOTE — Telephone Encounter (Signed)
Will attempt to initiate PA, unsure if this can be back dated.

## 2013-07-25 NOTE — Telephone Encounter (Signed)
Pt called requesting PA be processed for Remicade.  He also requesting the PA be back dated as his last infusion was denied.  Please advise

## 2013-07-26 NOTE — Telephone Encounter (Signed)
Called pt and informed that the hosiptal in responsible for the PA. There is no indication the MD prescribed the rx infusion.

## 2013-09-25 DIAGNOSIS — K432 Incisional hernia without obstruction or gangrene: Secondary | ICD-10-CM | POA: Insufficient documentation

## 2013-10-29 ENCOUNTER — Other Ambulatory Visit (HOSPITAL_COMMUNITY): Payer: Self-pay | Admitting: *Deleted

## 2013-10-30 ENCOUNTER — Encounter (HOSPITAL_COMMUNITY)
Admission: RE | Admit: 2013-10-30 | Discharge: 2013-10-30 | Disposition: A | Discharge status: Home / Self Care | Payer: BC Managed Care – PPO | Source: Ambulatory Visit | Attending: Pediatrics | Admitting: Pediatrics

## 2013-10-30 DIAGNOSIS — K5090 Crohn's disease, unspecified, without complications: Secondary | ICD-10-CM | POA: Insufficient documentation

## 2013-10-30 MED ORDER — SODIUM CHLORIDE 0.9 % IV SOLN
5.0000 mg/kg | INTRAVENOUS | Status: DC
  Administered 2013-10-30: 300 mg via INTRAVENOUS
  Filled 2013-10-30: qty 30

## 2013-10-30 MED ORDER — SODIUM CHLORIDE 0.9 % IV SOLN
INTRAVENOUS | Status: DC
  Administered 2013-10-30: 09:00:00 via INTRAVENOUS

## 2014-06-17 ENCOUNTER — Other Ambulatory Visit (HOSPITAL_COMMUNITY): Payer: Self-pay | Admitting: *Deleted

## 2014-06-18 ENCOUNTER — Encounter (HOSPITAL_COMMUNITY)
Admission: RE | Admit: 2014-06-18 | Discharge: 2014-06-18 | Disposition: A | Discharge status: Home / Self Care | Payer: BLUE CROSS/BLUE SHIELD | Source: Ambulatory Visit | Attending: Pediatrics | Admitting: Pediatrics

## 2014-06-18 DIAGNOSIS — K5000 Crohn's disease of small intestine without complications: Secondary | ICD-10-CM | POA: Insufficient documentation

## 2014-06-18 MED ORDER — SODIUM CHLORIDE 0.9 % IV SOLN
INTRAVENOUS | Status: DC
  Administered 2014-06-18: 250 mL via INTRAVENOUS

## 2014-06-18 MED ORDER — SODIUM CHLORIDE 0.9 % IV SOLN
5.0000 mg/kg | INTRAVENOUS | Status: DC
  Administered 2014-06-18: 300 mg via INTRAVENOUS
  Filled 2014-06-18: qty 30

## 2014-06-18 NOTE — Progress Notes (Signed)
Entered Fera[facility] discharge instructions in chart by mistake. Feraheme instructions were printed but were NOT given to patient.

## 2014-06-18 NOTE — Discharge Instructions (Signed)

## 2014-08-13 ENCOUNTER — Encounter (HOSPITAL_COMMUNITY)
Admission: RE | Admit: 2014-08-13 | Discharge: 2014-08-13 | Disposition: A | Discharge status: Home / Self Care | Payer: BLUE CROSS/BLUE SHIELD | Source: Ambulatory Visit | Attending: Pediatrics | Admitting: Pediatrics

## 2014-08-13 DIAGNOSIS — K5000 Crohn's disease of small intestine without complications: Secondary | ICD-10-CM | POA: Insufficient documentation

## 2014-08-13 LAB — CBC
HCT: 48.5 % (ref 39.0–52.0)
Hemoglobin: 16.8 g/dL (ref 13.0–17.0)
MCH: 30.8 pg (ref 26.0–34.0)
MCHC: 34.6 g/dL (ref 30.0–36.0)
MCV: 89 fL (ref 78.0–100.0)
Platelets: 167 10*3/uL (ref 150–400)
RBC: 5.45 MIL/uL (ref 4.22–5.81)
RDW: 12.3 % (ref 11.5–15.5)
WBC: 7.4 10*3/uL (ref 4.0–10.5)

## 2014-08-13 LAB — HEPATIC FUNCTION PANEL
ALT: 23 U/L (ref 17–63)
AST: 25 U/L (ref 15–41)
Albumin: 4.3 g/dL (ref 3.5–5.0)
Alkaline Phosphatase: 45 U/L (ref 38–126)
Bilirubin, Direct: 0.3 mg/dL (ref 0.1–0.5)
Indirect Bilirubin: 1.3 mg/dL — ABNORMAL HIGH (ref 0.3–0.9)
Total Bilirubin: 1.6 mg/dL — ABNORMAL HIGH (ref 0.3–1.2)
Total Protein: 7.1 g/dL (ref 6.5–8.1)

## 2014-08-13 LAB — SEDIMENTATION RATE: Sed Rate: 0 mm/hr (ref 0–16)

## 2014-08-13 MED ORDER — SODIUM CHLORIDE 0.9 % IV SOLN
5.0000 mg/kg | INTRAVENOUS | Status: DC
  Administered 2014-08-13: 300 mg via INTRAVENOUS
  Filled 2014-08-13: qty 30

## 2014-08-13 MED ORDER — SODIUM CHLORIDE 0.9 % IV SOLN
INTRAVENOUS | Status: DC
  Administered 2014-08-13: 10:00:00 via INTRAVENOUS

## 2014-10-08 ENCOUNTER — Encounter (HOSPITAL_COMMUNITY)
Admission: RE | Admit: 2014-10-08 | Discharge: 2014-10-08 | Disposition: A | Discharge status: Home / Self Care | Payer: BLUE CROSS/BLUE SHIELD | Source: Ambulatory Visit | Attending: Pediatrics | Admitting: Pediatrics

## 2014-10-08 ENCOUNTER — Encounter (HOSPITAL_COMMUNITY): Payer: BLUE CROSS/BLUE SHIELD

## 2014-10-08 DIAGNOSIS — K5000 Crohn's disease of small intestine without complications: Secondary | ICD-10-CM | POA: Diagnosis not present

## 2014-10-08 MED ORDER — SODIUM CHLORIDE 0.9 % IV SOLN
INTRAVENOUS | Status: DC
  Administered 2014-10-08: 10:00:00 via INTRAVENOUS

## 2014-10-08 MED ORDER — SODIUM CHLORIDE 0.9 % IV SOLN
5.0000 mg/kg | INTRAVENOUS | Status: DC
  Administered 2014-10-08: 300 mg via INTRAVENOUS
  Filled 2014-10-08: qty 30

## 2014-12-04 ENCOUNTER — Encounter (HOSPITAL_COMMUNITY)
Admission: RE | Admit: 2014-12-04 | Discharge: 2014-12-04 | Disposition: A | Discharge status: Home / Self Care | Payer: BLUE CROSS/BLUE SHIELD | Source: Ambulatory Visit | Attending: Pediatrics | Admitting: Pediatrics

## 2014-12-04 DIAGNOSIS — K5000 Crohn's disease of small intestine without complications: Secondary | ICD-10-CM | POA: Diagnosis not present

## 2014-12-04 LAB — HEPATIC FUNCTION PANEL
ALT: 22 U/L (ref 17–63)
AST: 31 U/L (ref 15–41)
Albumin: 4.6 g/dL (ref 3.5–5.0)
Alkaline Phosphatase: 42 U/L (ref 38–126)
Bilirubin, Direct: 0.3 mg/dL (ref 0.1–0.5)
Indirect Bilirubin: 1 mg/dL — ABNORMAL HIGH (ref 0.3–0.9)
Total Bilirubin: 1.3 mg/dL — ABNORMAL HIGH (ref 0.3–1.2)
Total Protein: 6.8 g/dL (ref 6.5–8.1)

## 2014-12-04 LAB — SEDIMENTATION RATE: Sed Rate: 2 mm/hr (ref 0–16)

## 2014-12-04 LAB — CBC
HCT: 49.8 % (ref 39.0–52.0)
Hemoglobin: 17.3 g/dL — ABNORMAL HIGH (ref 13.0–17.0)
MCH: 31.2 pg (ref 26.0–34.0)
MCHC: 34.7 g/dL (ref 30.0–36.0)
MCV: 89.7 fL (ref 78.0–100.0)
Platelets: 152 10*3/uL (ref 150–400)
RBC: 5.55 MIL/uL (ref 4.22–5.81)
RDW: 12.2 % (ref 11.5–15.5)
WBC: 7.1 10*3/uL (ref 4.0–10.5)

## 2014-12-04 LAB — C-REACTIVE PROTEIN: CRP: <0.5 mg/dL (ref <1.0)

## 2014-12-04 MED ORDER — SODIUM CHLORIDE 0.9 % IV SOLN: INTRAVENOUS | Status: DC

## 2014-12-04 MED ORDER — SODIUM CHLORIDE 0.9 % IV SOLN: 5.0000 mg/kg | INTRAVENOUS | Status: DC

## 2014-12-04 MED ORDER — SODIUM CHLORIDE 0.9 % IV SOLN
INTRAVENOUS | Status: DC
  Administered 2014-12-04: 10:00:00 via INTRAVENOUS

## 2014-12-04 MED ORDER — SODIUM CHLORIDE 0.9 % IV SOLN
5.0000 mg/kg | INTRAVENOUS | Status: DC
  Administered 2014-12-04: 300 mg via INTRAVENOUS
  Filled 2014-12-04: qty 30

## 2014-12-31 ENCOUNTER — Ambulatory Visit (INDEPENDENT_AMBULATORY_CARE_PROVIDER_SITE_OTHER): Payer: BLUE CROSS/BLUE SHIELD | Admitting: Internal Medicine

## 2014-12-31 ENCOUNTER — Encounter: Payer: Self-pay | Admitting: Internal Medicine

## 2014-12-31 ENCOUNTER — Ambulatory Visit (INDEPENDENT_AMBULATORY_CARE_PROVIDER_SITE_OTHER)
Admission: RE | Admit: 2014-12-31 | Discharge: 2014-12-31 | Disposition: A | Discharge status: Home / Self Care | Payer: BLUE CROSS/BLUE SHIELD | Source: Ambulatory Visit | Attending: Internal Medicine | Admitting: Internal Medicine

## 2014-12-31 VITALS — BP 140/90 | HR 72 | Temp 98.3°F | Resp 12 | Ht 70.0 in | Wt 155.0 lb

## 2014-12-31 DIAGNOSIS — Z23 Encounter for immunization: Secondary | ICD-10-CM

## 2014-12-31 DIAGNOSIS — R05 Cough: Secondary | ICD-10-CM

## 2014-12-31 DIAGNOSIS — J069 Acute upper respiratory infection, unspecified: Secondary | ICD-10-CM

## 2014-12-31 DIAGNOSIS — R059 Cough, unspecified: Secondary | ICD-10-CM | POA: Insufficient documentation

## 2014-12-31 DIAGNOSIS — J209 Acute bronchitis, unspecified: Secondary | ICD-10-CM | POA: Insufficient documentation

## 2014-12-31 DIAGNOSIS — B9789 Other viral agents as the cause of diseases classified elsewhere: Secondary | ICD-10-CM

## 2014-12-31 MED ORDER — HYDROCODONE-GUAIFENESIN 2.5-200 MG/5ML PO SOLN: 5.0000 mL | Freq: Four times a day (QID) | ORAL | Status: DC | PRN

## 2014-12-31 NOTE — Patient Instructions (Signed)

## 2014-12-31 NOTE — Progress Notes (Signed)
Subjective:  Patient ID: Jeffrey Reilly, male    DOB: [birth year]  Age: 34 y.o. MRN: [id number]  CC: Cough   HPI Jeffrey Reilly presents for a 3 week hx of intermittent NP cough with a "tickle" sensation in his throat. The cough is most prominent at night.  Outpatient Prescriptions Prior to Visit  Medication Sig Dispense Refill  . inFLIXimab (REMICADE) 100 MG injection Inject 300 mg at 0 wk, 2 wk, 6 wk, and 8 wk intervals  To be infused at [facility] for benadryl 50 mg and tylenol 500 mg prior to injection 1 each 1  . Probiotic Product (PROBIOTIC DAILY PO) Take by mouth. 2 caps daily    . IRON, FERROUS GLUCONATE, PO Take by mouth. 9 drops (liquid) BID    . predniSONE (DELTASONE) 10 MG tablet Take 10 mg by mouth. taper     No facility-administered medications prior to visit.    ROS Review of Systems  Constitutional: Negative.  Negative for fever, chills, diaphoresis, activity change, appetite change, fatigue and unexpected weight change.  HENT: Negative.  Negative for congestion, facial swelling, sinus pressure, sneezing, sore throat, tinnitus, trouble swallowing and voice change.   Eyes: Negative.   Respiratory: Positive for cough. Negative for choking, chest tightness, shortness of breath, wheezing and stridor.   Cardiovascular: Negative.  Negative for chest pain, palpitations and leg swelling.  Gastrointestinal: Negative.  Negative for nausea, vomiting, diarrhea, constipation and blood in stool.  Endocrine: Negative.   Genitourinary: Negative.   Musculoskeletal: Negative.   Skin: Negative.  Negative for rash.  Allergic/Immunologic: Positive for immunocompromised state.  Neurological: Negative.   Hematological: Negative.   Psychiatric/Behavioral: Negative.     Objective:  BP 140/90 mmHg  Pulse 72  Temp(Src) 98.3 F (36.8 C) (Oral)  Resp 12  Ht 5' 10"  (1.778 m)  Wt 155 lb (70.308 kg)  BMI 22.24 kg/m2  SpO2 97%  BP Readings from Last 3 Encounters:  12/31/14  140/90  12/04/14 117/73  10/08/14 114/66    Wt Readings from Last 3 Encounters:  12/31/14 155 lb (70.308 kg)  12/04/14 147 lb 14.9 oz (67.1 kg)  10/08/14 148 lb (67.132 kg)    Physical Exam  Constitutional: He is oriented to person, place, and time. He appears well-developed and well-nourished.  Non-toxic appearance. He does not have a sickly appearance. He does not appear ill. No distress.  HENT:  Mouth/Throat: Oropharynx is clear and moist. No oropharyngeal exudate.  Eyes: Conjunctivae are normal. Right eye exhibits no discharge. Left eye exhibits no discharge. No scleral icterus.  Neck: Normal range of motion. Neck supple. No JVD present. No tracheal deviation present. No thyromegaly present.  Cardiovascular: Normal rate, regular rhythm, normal heart sounds and intact distal pulses.  Exam reveals no gallop and no friction rub.   No murmur heard. Pulmonary/Chest: Effort normal and breath sounds normal. No stridor. No respiratory distress. He has no wheezes. He has no rales. He exhibits no tenderness.  Abdominal: Soft. Bowel sounds are normal. He exhibits no distension and no mass. There is no tenderness. There is no rebound and no guarding.  Musculoskeletal: Normal range of motion. He exhibits no edema or tenderness.  Lymphadenopathy:    He has no cervical adenopathy.  Neurological: He is oriented to person, place, and time.  Skin: Skin is warm and dry. No rash noted. He is not diaphoretic. No erythema. No pallor.  Psychiatric: He has a normal mood and affect. His behavior is normal.  Judgment and thought content normal.  Vitals reviewed.   Lab Results  Component Value Date   WBC 7.1 12/04/2014   HGB 17.3* 12/04/2014   HCT 49.8 12/04/2014   PLT 152 12/04/2014   ALT 22 12/04/2014   AST 31 12/04/2014    No results found.  Assessment & Plan:   Recardo was seen today for cough.  Diagnoses and all orders for this visit:  Cough- exam and CXR are normal. Will treat for Viral  URI. -     DG Chest 2 View; Future -     HYDROcodone-GuaiFENesin (FLOWTUSS) 2.5-200 MG/5ML SOLN; Take 5 mLs by mouth 4 (four) times daily as needed.  Need for influenza vaccination -     Flu Vaccine QUAD 36+ mos IM  Need for Tdap vaccination -     Tdap vaccine greater than or equal to 7yo IM  Viral URI with cough -     HYDROcodone-GuaiFENesin (FLOWTUSS) 2.5-200 MG/5ML SOLN; Take 5 mLs by mouth 4 (four) times daily as needed.  I have discontinued Mr. Dunson predniSONE and (IRON, FERROUS GLUCONATE, PO). I am also having him start on HYDROcodone-GuaiFENesin. Additionally, I am having him maintain his Probiotic Product (PROBIOTIC DAILY PO) and inFLIXimab.  Meds ordered this encounter  Medications  . HYDROcodone-GuaiFENesin (FLOWTUSS) 2.5-200 MG/5ML SOLN    Sig: Take 5 mLs by mouth 4 (four) times daily as needed.    Dispense:  118 mL    Refill:  0     Follow-up: Return in about 3 weeks (around 01/21/2015).  Scarlette Calico, MD

## 2015-01-01 ENCOUNTER — Encounter: Payer: Self-pay | Admitting: Internal Medicine

## 2015-01-21 ENCOUNTER — Encounter: Payer: Self-pay | Admitting: Internal Medicine

## 2015-01-21 ENCOUNTER — Ambulatory Visit (INDEPENDENT_AMBULATORY_CARE_PROVIDER_SITE_OTHER): Payer: BLUE CROSS/BLUE SHIELD | Admitting: Internal Medicine

## 2015-01-21 ENCOUNTER — Ambulatory Visit (INDEPENDENT_AMBULATORY_CARE_PROVIDER_SITE_OTHER)
Admission: RE | Admit: 2015-01-21 | Discharge: 2015-01-21 | Disposition: A | Discharge status: Home / Self Care | Payer: BLUE CROSS/BLUE SHIELD | Source: Ambulatory Visit | Attending: Internal Medicine | Admitting: Internal Medicine

## 2015-01-21 VITALS — BP 112/78 | HR 81 | Temp 98.5°F | Resp 16 | Ht 70.0 in | Wt 157.0 lb

## 2015-01-21 DIAGNOSIS — J209 Acute bronchitis, unspecified: Secondary | ICD-10-CM | POA: Diagnosis not present

## 2015-01-21 DIAGNOSIS — R0789 Other chest pain: Secondary | ICD-10-CM

## 2015-01-21 DIAGNOSIS — R059 Cough, unspecified: Secondary | ICD-10-CM

## 2015-01-21 DIAGNOSIS — R05 Cough: Secondary | ICD-10-CM

## 2015-01-21 MED ORDER — HYDROCODONE-HOMATROPINE 5-1.5 MG/5ML PO SYRP
5.0000 mL | ORAL_SOLUTION | Freq: Three times a day (TID) | ORAL | Status: DC | PRN
Start: 2015-01-21 — End: 2015-02-11

## 2015-01-21 MED ORDER — MOXIFLOXACIN HCL 400 MG PO TABS: 400.0000 mg | ORAL_TABLET | Freq: Every day | ORAL | Status: AC

## 2015-01-21 NOTE — Patient Instructions (Signed)

## 2015-01-21 NOTE — Progress Notes (Signed)
Pre visit review using our clinic review tool, if applicable. No additional management support is needed unless otherwise documented below in the visit note. 

## 2015-01-25 ENCOUNTER — Encounter: Payer: Self-pay | Admitting: Internal Medicine

## 2015-01-26 ENCOUNTER — Encounter: Payer: Self-pay | Admitting: Internal Medicine

## 2015-01-26 NOTE — Progress Notes (Signed)
Subjective:  Patient ID: Jeffrey Reilly, male    DOB: [birth year]  Age: 34 y.o. MRN: [id number]  CC: Cough   HPI Samual Beals presents for follow-up on cough. He was last seen 3 weeks ago at which time he had a nonproductive cough. He tells me that now the cough has become productive of thick yellow phlegm in the mornings. He is also coughing so severely he has developed some discomfort on the left side of his rib cage that he describes as a sharp sensation with cough and movement.  Outpatient Prescriptions Prior to Visit  Medication Sig Dispense Refill  . inFLIXimab (REMICADE) 100 MG injection Inject 300 mg at 0 wk, 2 wk, 6 wk, and 8 wk intervals  To be infused at [place] for benadryl 50 mg and tylenol 500 mg prior to injection 1 each 1  . Probiotic Product (PROBIOTIC DAILY PO) Take by mouth. 2 caps daily    . HYDROcodone-GuaiFENesin (FLOWTUSS) 2.5-200 MG/5ML SOLN Take 5 mLs by mouth 4 (four) times daily as needed. (Patient not taking: Reported on 01/21/2015) 118 mL 0   No facility-administered medications prior to visit.    ROS Review of Systems  Constitutional: Negative.  Negative for fever, chills, diaphoresis, activity change, appetite change and fatigue.  HENT: Negative.  Negative for congestion, sinus pressure, sore throat and trouble swallowing.   Eyes: Negative.   Respiratory: Positive for cough. Negative for apnea, choking, chest tightness, shortness of breath, wheezing and stridor.   Cardiovascular: Positive for chest pain. Negative for palpitations and leg swelling.  Gastrointestinal: Negative.  Negative for nausea, vomiting, abdominal pain, diarrhea and constipation.  Endocrine: Negative.   Genitourinary: Negative.   Musculoskeletal: Negative.  Negative for myalgias, back pain, arthralgias and neck pain.  Skin: Negative.  Negative for rash.  Allergic/Immunologic: Negative.   Neurological: Negative.  Negative for dizziness, speech difficulty, weakness,  light-headedness and numbness.  Hematological: Negative.  Negative for adenopathy. Does not bruise/bleed easily.  Psychiatric/Behavioral: Negative.     Objective:  BP 112/78 mmHg  Pulse 81  Temp(Src) 98.5 F (36.9 C) (Oral)  Resp 16  Ht 5' 10"  (1.778 m)  Wt 157 lb (71.215 kg)  BMI 22.53 kg/m2  SpO2 96%  BP Readings from Last 3 Encounters:  01/21/15 112/78  12/31/14 140/90  12/04/14 117/73    Wt Readings from Last 3 Encounters:  01/21/15 157 lb (71.215 kg)  12/31/14 155 lb (70.308 kg)  12/04/14 147 lb 14.9 oz (67.1 kg)    Physical Exam  Constitutional: He is oriented to person, place, and time. He appears well-developed and well-nourished.  Non-toxic appearance. He does not have a sickly appearance. He does not appear ill. No distress.  HENT:  Head: Normocephalic and atraumatic.  Mouth/Throat: Oropharynx is clear and moist. No oropharyngeal exudate.  Eyes: Conjunctivae are normal. Right eye exhibits no discharge. Left eye exhibits no discharge. No scleral icterus.  Neck: Normal range of motion. Neck supple. No JVD present. No tracheal deviation present. No thyromegaly present.  Cardiovascular: Normal rate, regular rhythm, normal heart sounds and intact distal pulses.  Exam reveals no gallop and no friction rub.   No murmur heard. Pulmonary/Chest: Effort normal and breath sounds normal. No accessory muscle usage or stridor. No respiratory distress. He has no decreased breath sounds. He has no wheezes. He has no rhonchi. He has no rales. He exhibits no mass, no tenderness, no bony tenderness, no crepitus, no edema, no deformity, no swelling and no  retraction.  Abdominal: Soft. Bowel sounds are normal. He exhibits no distension and no mass. There is no tenderness. There is no rebound and no guarding.  Musculoskeletal: Normal range of motion. He exhibits no edema or tenderness.  Lymphadenopathy:    He has no cervical adenopathy.  Neurological: He is oriented to person, place, and  time.  Skin: Skin is warm and dry. No rash noted. He is not diaphoretic. No erythema. No pallor.  Vitals reviewed.   Lab Results  Component Value Date   WBC 7.1 12/04/2014   HGB 17.3* 12/04/2014   HCT 49.8 12/04/2014   PLT 152 12/04/2014   ALT 22 12/04/2014   AST 31 12/04/2014    Dg Chest 2 View  01/22/2015  CLINICAL DATA:  Two months of cough chest congestion and left chest wall discomfort. EXAM: CHEST  2 VIEW COMPARISON:  PA and lateral chest x-ray of December 21, 2014 FINDINGS: The lungs are well-expanded. There is no focal infiltrate, pleural effusion, or pneumothorax. There is no abnormal pleural thickening. The heart and pulmonary vascularity are normal. The mediastinum is normal in width. There is no pleural effusion. The bony thorax exhibits no acute abnormality. IMPRESSION: There is no evidence of pneumonia nor other acute cardiopulmonary abnormality. Electronically Signed   By: David  Martinique M.D.   On: 01/22/2015 07:51    Assessment & Plan:   Casen was seen today for cough.  Diagnoses and all orders for this visit:  Acute bronchitis, unspecified organism- will treat the infection with Avelox and will control the cough with Hycodan -     HYDROcodone-homatropine (HYCODAN) 5-1.5 MG/5ML syrup; Take 5 mLs by mouth every 8 (eight) hours as needed for cough. -     moxifloxacin (AVELOX) 400 MG tablet; Take 1 tablet (400 mg total) by mouth daily.  Cough- his chest x-ray is normal, will treat him for acute bronchitis. -     HYDROcodone-homatropine (HYCODAN) 5-1.5 MG/5ML syrup; Take 5 mLs by mouth every 8 (eight) hours as needed for cough. -     DG Chest 2 View; Future  Left-sided chest wall pain- this is consistent with musculoskeletal chest wall pain. Will control the cough and he will try some over-the-counter Tylenol as well. -     DG Chest 2 View; Future   I have discontinued Mr. Ternes HYDROcodone-GuaiFENesin. I am also having him start on HYDROcodone-homatropine and  moxifloxacin. Additionally, I am having him maintain his Probiotic Product (PROBIOTIC DAILY PO) and inFLIXimab.  Meds ordered this encounter  Medications  . HYDROcodone-homatropine (HYCODAN) 5-1.5 MG/5ML syrup    Sig: Take 5 mLs by mouth every 8 (eight) hours as needed for cough.    Dispense:  120 mL    Refill:  0  . moxifloxacin (AVELOX) 400 MG tablet    Sig: Take 1 tablet (400 mg total) by mouth daily.    Dispense:  5 tablet    Refill:  0     Follow-up: Return in about 3 weeks (around 02/11/2015).  Scarlette Calico, MD

## 2015-01-27 ENCOUNTER — Other Ambulatory Visit (HOSPITAL_COMMUNITY): Payer: Self-pay | Admitting: *Deleted

## 2015-01-28 ENCOUNTER — Encounter (HOSPITAL_COMMUNITY)
Admission: RE | Admit: 2015-01-28 | Discharge: 2015-01-28 | Disposition: A | Discharge status: Home / Self Care | Payer: BLUE CROSS/BLUE SHIELD | Source: Ambulatory Visit | Attending: Pediatrics | Admitting: Pediatrics

## 2015-01-28 DIAGNOSIS — K5000 Crohn's disease of small intestine without complications: Secondary | ICD-10-CM | POA: Insufficient documentation

## 2015-02-06 ENCOUNTER — Other Ambulatory Visit (HOSPITAL_COMMUNITY): Payer: Self-pay | Admitting: *Deleted

## 2015-02-09 ENCOUNTER — Encounter (HOSPITAL_COMMUNITY)
Admission: RE | Admit: 2015-02-09 | Discharge: 2015-02-09 | Disposition: A | Discharge status: Home / Self Care | Payer: BLUE CROSS/BLUE SHIELD | Source: Ambulatory Visit | Attending: Pediatrics | Admitting: Pediatrics

## 2015-02-09 DIAGNOSIS — K5000 Crohn's disease of small intestine without complications: Secondary | ICD-10-CM | POA: Diagnosis not present

## 2015-02-09 LAB — RENAL FUNCTION PANEL
Albumin: 4.5 g/dL (ref 3.5–5.0)
Anion gap: 6 (ref 5–15)
BUN: 19 mg/dL (ref 6–20)
CO2: 29 mmol/L (ref 22–32)
Calcium: 9.6 mg/dL (ref 8.9–10.3)
Chloride: 105 mmol/L (ref 101–111)
Creatinine, Ser: 1.2 mg/dL (ref 0.61–1.24)
GFR calc Af Amer: >60 mL/min (ref >60)
GFR calc non Af Amer: >60 mL/min (ref >60)
Glucose, Bld: 100 mg/dL — ABNORMAL HIGH (ref 65–99)
Phosphorus: 2.5 mg/dL (ref 2.5–4.6)
Potassium: 4.4 mmol/L (ref 3.5–5.1)
Sodium: 140 mmol/L (ref 135–145)

## 2015-02-09 LAB — C-REACTIVE PROTEIN: CRP: 1.4 mg/dL — ABNORMAL HIGH (ref <1.0)

## 2015-02-09 MED ORDER — SODIUM CHLORIDE 0.9 % IV SOLN
INTRAVENOUS | Status: DC
  Administered 2015-02-09: 11:00:00 via INTRAVENOUS

## 2015-02-09 MED ORDER — SODIUM CHLORIDE 0.9 % IV SOLN
5.0000 mg/kg | INTRAVENOUS | Status: DC
  Administered 2015-02-09: 400 mg via INTRAVENOUS
  Filled 2015-02-09: qty 40

## 2015-02-11 ENCOUNTER — Encounter: Payer: Self-pay | Admitting: Internal Medicine

## 2015-02-11 ENCOUNTER — Ambulatory Visit (INDEPENDENT_AMBULATORY_CARE_PROVIDER_SITE_OTHER): Payer: BLUE CROSS/BLUE SHIELD | Admitting: Internal Medicine

## 2015-02-11 VITALS — BP 120/78 | HR 74 | Temp 98.4°F | Resp 16 | Ht 70.0 in | Wt 156.0 lb

## 2015-02-11 DIAGNOSIS — J209 Acute bronchitis, unspecified: Secondary | ICD-10-CM | POA: Diagnosis not present

## 2015-02-12 NOTE — Progress Notes (Signed)
Subjective:  Patient ID: Jeffrey Reilly, male    DOB: 05-22-1980  Age: 34 y.o. MRN: [id number]  CC: Cough   HPI Jeffrey Reilly presents for f/up - his cough has resolved, he feels well and offers no complaints.  Outpatient Prescriptions Prior to Visit  Medication Sig Dispense Refill  . inFLIXimab (REMICADE) 100 MG injection Inject 300 mg at 0 wk, 2 wk, 6 wk, and 8 wk intervals  To be infused at [facility] for benadryl 50 mg and tylenol 500 mg prior to injection 1 each 1  . Probiotic Product (PROBIOTIC DAILY PO) Take by mouth. 2 caps daily    . HYDROcodone-homatropine (HYCODAN) 5-1.5 MG/5ML syrup Take 5 mLs by mouth every 8 (eight) hours as needed for cough. 120 mL 0   No facility-administered medications prior to visit.    ROS Review of Systems  Constitutional: Negative.  Negative for fever, chills, diaphoresis and fatigue.  HENT: Negative.  Negative for sore throat.   Eyes: Negative.   Respiratory: Negative.  Negative for cough, choking, chest tightness, shortness of breath and stridor.   Cardiovascular: Negative.  Negative for chest pain, palpitations and leg swelling.  Gastrointestinal: Negative.  Negative for nausea, abdominal pain, diarrhea and blood in stool.  Endocrine: Negative.   Genitourinary: Negative.   Musculoskeletal: Negative.   Skin: Negative.  Negative for rash.  Allergic/Immunologic: Negative.   Neurological: Negative.   Hematological: Negative.   Psychiatric/Behavioral: Negative.     Objective:  BP 120/78 mmHg  Pulse 74  Temp(Src) 98.4 F (36.9 C) (Oral)  Resp 16  Ht 5' 10"  (1.778 m)  Wt 156 lb (70.761 kg)  BMI 22.38 kg/m2  SpO2 97%  BP Readings from Last 3 Encounters:  02/11/15 120/78  02/09/15 123/73  01/28/15 130/76    Wt Readings from Last 3 Encounters:  02/11/15 156 lb (70.761 kg)  02/09/15 154 lb 5.2 oz (70 kg)  01/28/15 154 lb 5.2 oz (70 kg)    Physical Exam  Constitutional: He is oriented to person, place, and time. No  distress.  HENT:  Mouth/Throat: Oropharynx is clear and moist. No oropharyngeal exudate.  Eyes: Conjunctivae are normal. Right eye exhibits no discharge. Left eye exhibits no discharge. No scleral icterus.  Neck: Normal range of motion. Neck supple. No JVD present. No tracheal deviation present. No thyromegaly present.  Cardiovascular: Normal rate, regular rhythm, normal heart sounds and intact distal pulses.  Exam reveals no gallop and no friction rub.   No murmur heard. Pulmonary/Chest: Effort normal and breath sounds normal. No stridor. No respiratory distress. He has no wheezes. He has no rales. He exhibits no tenderness.  Abdominal: Soft. Bowel sounds are normal. He exhibits no distension and no mass. There is no tenderness. There is no rebound and no guarding.  Musculoskeletal: Normal range of motion. He exhibits no edema or tenderness.  Lymphadenopathy:    He has no cervical adenopathy.  Neurological: He is oriented to person, place, and time.  Skin: Skin is warm and dry. No rash noted. He is not diaphoretic. No erythema. No pallor.  Vitals reviewed.   Lab Results  Component Value Date   WBC 7.1 12/04/2014   HGB 17.3* 12/04/2014   HCT 49.8 12/04/2014   PLT 152 12/04/2014   GLUCOSE 100* 02/09/2015   ALT 22 12/04/2014   AST 31 12/04/2014   NA 140 02/09/2015   K 4.4 02/09/2015   CL 105 02/09/2015   CREATININE 1.20 02/09/2015   BUN  19 02/09/2015   CO2 29 02/09/2015    No results found.  Assessment & Plan:   Jeffrey Reilly was seen today for cough.  Diagnoses and all orders for this visit:  Acute bronchitis, unspecified organism- his CXR was normal, this has resolved  I have discontinued Jeffrey Reilly HYDROcodone-homatropine. I am also having him maintain his Probiotic Product (PROBIOTIC DAILY PO) and inFLIXimab.  No orders of the defined types were placed in this encounter.     Follow-up: No Follow-up on file.  Scarlette Calico, MD

## 2015-04-08 ENCOUNTER — Encounter (HOSPITAL_COMMUNITY)
Admission: RE | Admit: 2015-04-08 | Discharge: 2015-04-08 | Disposition: A | Discharge status: Home / Self Care | Payer: BLUE CROSS/BLUE SHIELD | Source: Ambulatory Visit | Attending: Pediatrics | Admitting: Pediatrics

## 2015-04-08 DIAGNOSIS — K5000 Crohn's disease of small intestine without complications: Secondary | ICD-10-CM | POA: Diagnosis not present

## 2015-04-08 MED ORDER — SODIUM CHLORIDE 0.9 % IV SOLN
INTRAVENOUS | Status: DC
  Administered 2015-04-08: 10:00:00 via INTRAVENOUS

## 2015-04-08 MED ORDER — SODIUM CHLORIDE 0.9 % IV SOLN
5.0000 mg/kg | INTRAVENOUS | Status: DC
  Administered 2015-04-08: 400 mg via INTRAVENOUS
  Filled 2015-04-08: qty 40

## 2015-06-03 ENCOUNTER — Ambulatory Visit (HOSPITAL_COMMUNITY)
Admission: RE | Admit: 2015-06-03 | Discharge: 2015-06-03 | Disposition: A | Discharge status: Home / Self Care | Payer: BLUE CROSS/BLUE SHIELD | Source: Ambulatory Visit | Attending: Pediatrics | Admitting: Pediatrics

## 2015-06-03 DIAGNOSIS — K5090 Crohn's disease, unspecified, without complications: Secondary | ICD-10-CM | POA: Insufficient documentation

## 2015-06-03 DIAGNOSIS — Z79899 Other long term (current) drug therapy: Secondary | ICD-10-CM | POA: Diagnosis not present

## 2015-06-03 DIAGNOSIS — Z888 Allergy status to other drugs, medicaments and biological substances status: Secondary | ICD-10-CM | POA: Insufficient documentation

## 2015-06-03 LAB — DIFFERENTIAL
Basophils Absolute: 0 10*3/uL (ref 0.0–0.1)
Basophils Relative: 0 %
Eosinophils Absolute: 0.2 10*3/uL (ref 0.0–0.7)
Eosinophils Relative: 2 %
Lymphocytes Relative: 39 %
Lymphs Abs: 3.7 10*3/uL (ref 0.7–4.0)
Monocytes Absolute: 0.6 10*3/uL (ref 0.1–1.0)
Monocytes Relative: 6 %
Neutro Abs: 5 10*3/uL (ref 1.7–7.7)
Neutrophils Relative %: 53 %

## 2015-06-03 LAB — CBC
HCT: 53 % — ABNORMAL HIGH (ref 39.0–52.0)
Hemoglobin: 18.5 g/dL — ABNORMAL HIGH (ref 13.0–17.0)
MCH: 31.3 pg (ref 26.0–34.0)
MCHC: 34.9 g/dL (ref 30.0–36.0)
MCV: 89.5 fL (ref 78.0–100.0)
Platelets: 155 10*3/uL (ref 150–400)
RBC: 5.92 MIL/uL — ABNORMAL HIGH (ref 4.22–5.81)
RDW: 12.4 % (ref 11.5–15.5)
WBC: 9.4 10*3/uL (ref 4.0–10.5)

## 2015-06-03 LAB — HEPATIC FUNCTION PANEL
ALT: 25 U/L (ref 17–63)
AST: 33 U/L (ref 15–41)
Albumin: 4.7 g/dL (ref 3.5–5.0)
Alkaline Phosphatase: 46 U/L (ref 38–126)
Bilirubin, Direct: 0.3 mg/dL (ref 0.1–0.5)
Indirect Bilirubin: 0.9 mg/dL (ref 0.3–0.9)
Total Bilirubin: 1.2 mg/dL (ref 0.3–1.2)
Total Protein: 7.7 g/dL (ref 6.5–8.1)

## 2015-06-03 LAB — C-REACTIVE PROTEIN: CRP: 1 mg/dL — ABNORMAL HIGH (ref <1.0)

## 2015-06-03 LAB — SEDIMENTATION RATE: Sed Rate: 0 mm/hr (ref 0–16)

## 2015-06-03 MED ORDER — SODIUM CHLORIDE 0.9 % IV SOLN
5.0000 mg/kg | INTRAVENOUS | Status: DC
  Administered 2015-06-03: 400 mg via INTRAVENOUS
  Filled 2015-06-03: qty 40

## 2015-06-03 MED ORDER — SODIUM CHLORIDE 0.9 % IV SOLN
INTRAVENOUS | Status: DC
  Administered 2015-06-03: 10:00:00 via INTRAVENOUS

## 2015-06-17 ENCOUNTER — Telehealth: Payer: Self-pay | Admitting: Internal Medicine

## 2015-06-17 NOTE — Telephone Encounter (Signed)
Spoke to triage nurse. Forwarding to PCP for advise since pt is on an immunosuppressant.

## 2015-06-17 NOTE — Telephone Encounter (Signed)
He needs to be seen with CXR

## 2015-06-17 NOTE — Telephone Encounter (Signed)
LVM for pt to call back as soon as possible.   Re: pt needs an appt

## 2015-06-17 NOTE — Telephone Encounter (Signed)
Patient Name: Jeffrey Reilly  DOB: Aug 26, 1980    Initial Comment caller states he has flu like sx   Nurse Assessment  Nurse: Leilani Merl, RN, Heather Date/Time (Eastern Time): 06/17/2015 1:47:23 PM  Confirm and document reason for call. If symptomatic, describe symptoms. You must click the next button to save text entered. ---caller states that he started with a fever on Monday, he is coughing, slightly achy  Has the patient traveled out of the country within the last 30 days? ---Not Applicable  Does the patient have any new or worsening symptoms? ---Yes  Will a triage be completed? ---Yes  Related visit to physician within the last 2 weeks? ---No  Does the PT have any chronic conditions? (i.e. diabetes, asthma, etc.) ---Yes  List chronic conditions. ---crohns  Is this a behavioral health or substance abuse call? ---No     Guidelines    Guideline Title Affirmed Question Affirmed Notes  Influenza - Seasonal Patient is HIGH RISK (e.g., age > 29 years, pregnant, HIV+, or chronic medical condition)    Final Disposition User   Call PCP Now Standifer, RN, Water quality scientist    Comments  Patient is on remicade for crohns, may be high risk due to immunotherapy. Started flu symptoms on Sunday. Called the office and information given.   Disagree/Comply: Comply

## 2015-06-18 ENCOUNTER — Other Ambulatory Visit (INDEPENDENT_AMBULATORY_CARE_PROVIDER_SITE_OTHER): Payer: BLUE CROSS/BLUE SHIELD

## 2015-06-18 ENCOUNTER — Encounter: Payer: Self-pay | Admitting: Internal Medicine

## 2015-06-18 ENCOUNTER — Ambulatory Visit (INDEPENDENT_AMBULATORY_CARE_PROVIDER_SITE_OTHER): Payer: BLUE CROSS/BLUE SHIELD | Admitting: Internal Medicine

## 2015-06-18 ENCOUNTER — Ambulatory Visit (INDEPENDENT_AMBULATORY_CARE_PROVIDER_SITE_OTHER)
Admission: RE | Admit: 2015-06-18 | Discharge: 2015-06-18 | Disposition: A | Discharge status: Home / Self Care | Payer: BLUE CROSS/BLUE SHIELD | Source: Ambulatory Visit | Attending: Internal Medicine | Admitting: Internal Medicine

## 2015-06-18 ENCOUNTER — Telehealth: Payer: Self-pay

## 2015-06-18 ENCOUNTER — Telehealth: Payer: Self-pay | Admitting: Geriatric Medicine

## 2015-06-18 VITALS — BP 120/76 | HR 90 | Temp 100.0°F | Resp 16 | Ht 70.0 in | Wt 153.0 lb

## 2015-06-18 DIAGNOSIS — J111 Influenza due to unidentified influenza virus with other respiratory manifestations: Secondary | ICD-10-CM | POA: Insufficient documentation

## 2015-06-18 DIAGNOSIS — R05 Cough: Secondary | ICD-10-CM

## 2015-06-18 DIAGNOSIS — R509 Fever, unspecified: Secondary | ICD-10-CM | POA: Diagnosis not present

## 2015-06-18 DIAGNOSIS — K50812 Crohn's disease of both small and large intestine with intestinal obstruction: Secondary | ICD-10-CM

## 2015-06-18 DIAGNOSIS — R059 Cough, unspecified: Secondary | ICD-10-CM

## 2015-06-18 LAB — CBC WITH DIFFERENTIAL/PLATELET
Basophils Absolute: 0 10*3/uL (ref 0.0–0.1)
Basophils Relative: 0.4 % (ref 0.0–3.0)
Eosinophils Absolute: 0 10*3/uL (ref 0.0–0.7)
Eosinophils Relative: 0.1 % (ref 0.0–5.0)
HCT: 51.4 % (ref 39.0–52.0)
Hemoglobin: 18 g/dL (ref 13.0–17.0)
Lymphocytes Relative: 33.2 % (ref 12.0–46.0)
Lymphs Abs: 1.4 10*3/uL (ref 0.7–4.0)
MCHC: 35 g/dL (ref 30.0–36.0)
MCV: 87.1 fl (ref 78.0–100.0)
Monocytes Absolute: 0.6 10*3/uL (ref 0.1–1.0)
Monocytes Relative: 15.8 % — ABNORMAL HIGH (ref 3.0–12.0)
Neutro Abs: 2.1 10*3/uL (ref 1.4–7.7)
Neutrophils Relative %: 50.5 % (ref 43.0–77.0)
Platelets: 128 10*3/uL — ABNORMAL LOW (ref 150.0–400.0)
RBC: 5.91 Mil/uL — ABNORMAL HIGH (ref 4.22–5.81)
RDW: 12.2 % (ref 11.5–15.5)
WBC: 4.1 10*3/uL (ref 4.0–10.5)

## 2015-06-18 LAB — BASIC METABOLIC PANEL
BUN: 19 mg/dL (ref 6–23)
CO2: 31 mEq/L (ref 19–32)
Calcium: 9.5 mg/dL (ref 8.4–10.5)
Chloride: 101 mEq/L (ref 96–112)
Creatinine, Ser: 1.29 mg/dL (ref 0.40–1.50)
GFR: 66.76 mL/min (ref >60.00)
Glucose, Bld: 105 mg/dL — ABNORMAL HIGH (ref 70–99)
Potassium: 4.2 mEq/L (ref 3.5–5.1)
Sodium: 138 mEq/L (ref 135–145)

## 2015-06-18 MED ORDER — OSELTAMIVIR PHOSPHATE 75 MG PO CAPS: 75.0000 mg | ORAL_CAPSULE | Freq: Two times a day (BID) | ORAL | Status: AC

## 2015-06-18 MED ORDER — PROMETHAZINE-DM 6.25-15 MG/5ML PO SYRP: 5.0000 mL | ORAL_SOLUTION | Freq: Four times a day (QID) | ORAL | Status: DC | PRN

## 2015-06-18 NOTE — Patient Instructions (Signed)

## 2015-06-18 NOTE — Progress Notes (Signed)
Pre visit review using our clinic review tool, if applicable. No additional management support is needed unless otherwise documented below in the visit note. 

## 2015-06-18 NOTE — Telephone Encounter (Signed)
Per louis/[facility] on [address]---he is transfering rx for tamiflu and cough med over to [facility] on [address]---per patient request---patient advised that chest xray is positive for flu but negative for bronchitis, so no anbx is needed at this time, we will call patient back with lab results (checking for dehydration) after those labs result back to [place]

## 2015-06-18 NOTE — Telephone Encounter (Signed)
Lab called in a critical lab for this patient. His hemoglobin is 18.0. Please advise, thanks.

## 2015-06-19 LAB — HIV ANTIBODY (ROUTINE TESTING W REFLEX): HIV 1&2 Ab, 4th Generation: NONREACTIVE

## 2015-06-19 NOTE — Progress Notes (Signed)
Subjective:  Patient ID: Armani Brar, male    DOB: May 02, 1980  Age: 35 y.o. MRN: [id number]  CC: Cough   HPI Sayid Moll presents for a 5 day history of nonproductive cough, low-grade fever, muscle aches, sore throat, and fatigue. He got sick one day after his wife was diagnosed with influenza. His highest fever has been about 101. He denies abdominal pain, nausea, vomiting, diarrhea, rash, or chest pain.  Outpatient Prescriptions Prior to Visit  Medication Sig Dispense Refill  . inFLIXimab (REMICADE) 100 MG injection Inject 300 mg at 0 wk, 2 wk, 6 wk, and 8 wk intervals  To be infused at [facility] for benadryl 50 mg and tylenol 500 mg prior to injection 1 each 1  . Probiotic Product (PROBIOTIC DAILY PO) Take by mouth. 2 caps daily     No facility-administered medications prior to visit.    ROS Review of Systems  Constitutional: Positive for fever, chills and fatigue. Negative for diaphoresis, activity change and appetite change.  HENT: Positive for sore throat. Negative for congestion, sinus pressure, trouble swallowing and voice change.   Eyes: Negative.   Respiratory: Positive for cough. Negative for choking, chest tightness, shortness of breath, wheezing and stridor.   Cardiovascular: Negative.  Negative for chest pain, palpitations and leg swelling.  Gastrointestinal: Negative.  Negative for nausea, vomiting, abdominal pain, diarrhea and constipation.  Endocrine: Negative.   Genitourinary: Negative.   Musculoskeletal: Positive for myalgias. Negative for back pain, joint swelling, neck pain and neck stiffness.  Skin: Negative.  Negative for color change, pallor and rash.  Allergic/Immunologic: Negative.   Neurological: Negative.  Negative for dizziness, tremors, weakness, numbness and headaches.  Hematological: Negative.  Negative for adenopathy. Does not bruise/bleed easily.  Psychiatric/Behavioral: Negative.     Objective:  BP 120/76 mmHg  Pulse 90   Temp(Src) 100 F (37.8 C) (Oral)  Ht 5' 10"  (1.778 m)  Wt 153 lb (69.4 kg)  BMI 21.95 kg/m2  SpO2 94%  BP Readings from Last 3 Encounters:  06/18/15 120/76  06/03/15 124/70  04/08/15 117/68    Wt Readings from Last 3 Encounters:  06/18/15 153 lb (69.4 kg)  06/03/15 154 lb 5.2 oz (70 kg)  04/08/15 152 lb 1.9 oz (69 kg)    Physical Exam  Constitutional: He is oriented to person, place, and time.  Non-toxic appearance. He does not have a sickly appearance. He does not appear ill. No distress.  HENT:  Mouth/Throat: Oropharynx is clear and moist. No oropharyngeal exudate.  Eyes: Conjunctivae are normal. Right eye exhibits no discharge. Left eye exhibits no discharge. No scleral icterus.  Neck: Normal range of motion. Neck supple. No JVD present. No tracheal deviation present. No thyromegaly present.  Cardiovascular: Normal rate, regular rhythm, normal heart sounds and intact distal pulses.  Exam reveals no gallop and no friction rub.   No murmur heard. Pulmonary/Chest: Effort normal and breath sounds normal. No stridor. No respiratory distress. He has no wheezes. He has no rales. He exhibits no tenderness.  Abdominal: Soft. Bowel sounds are normal. He exhibits no distension and no mass. There is no tenderness. There is no rebound and no guarding.  Musculoskeletal: Normal range of motion. He exhibits no edema or tenderness.  Lymphadenopathy:    He has no cervical adenopathy.  Neurological: He is oriented to person, place, and time.  Skin: Skin is warm and dry. No rash noted. He is not diaphoretic. No erythema. No pallor.  Vitals reviewed.  Lab Results  Component Value Date   WBC 4.1 06/18/2015   HGB 18.0 Repeated and verified X2.* 06/18/2015   HCT 51.4 06/18/2015   PLT 128.0* 06/18/2015   GLUCOSE 105* 06/18/2015   ALT 25 06/03/2015   AST 33 06/03/2015   NA 138 06/18/2015   K 4.2 06/18/2015   CL 101 06/18/2015   CREATININE 1.29 06/18/2015   BUN 19 06/18/2015   CO2 31  06/18/2015    Dg Chest 2 View  06/18/2015  CLINICAL DATA:  Cough and congestion. EXAM: CHEST  2 VIEW COMPARISON:  01/21/2015. FINDINGS: Mediastinum hilar structures are normal. Mild bilateral perihilar pulmonary interstitial prominence noted. Mild pneumonitis cannot be excluded. Heart size stable. No prominent pleural effusion stable mild pleural thickening most likely scarring. No pneumothorax. No acute bony abnormality . IMPRESSION: Mild bilateral pulmonary interstitial prominence. Mild pneumonitis cannot be excluded. Electronically Signed   By: Marcello Moores  Register   On: 06/18/2015 14:53    Assessment & Plan:   Crecencio was seen today for cough.  Diagnoses and all orders for this visit:  Cough- his chest x-ray shows mild interstitial prominence, I will treat for influenza -     DG Chest 2 View; Future  Fever, unspecified- CBC is normal, HIV is negative, electrolytes and renal function are normal. I will treat for influenza -     CBC with Differential/Platelet; Future -     Basic metabolic panel; Future -     HIV antibody; Future -     DG Chest 2 View; Future  Influenza with respiratory manifestation- I will treat the infection with Tamiflu will control the cough with Phenergan DM. -     promethazine-dextromethorphan (PROMETHAZINE-DM) 6.25-15 MG/5ML syrup; Take 5 mLs by mouth 4 (four) times daily as needed for cough. -     oseltamivir (TAMIFLU) 75 MG capsule; Take 1 capsule (75 mg total) by mouth 2 (two) times daily. -     DG Chest 2 View; Future  Crohn's disease of both small and large intestine with intestinal obstruction ([place])   I am having Mr. Gallaway start on promethazine-dextromethorphan and oseltamivir. I am also having him maintain his Probiotic Product (PROBIOTIC DAILY PO) and inFLIXimab.  Meds ordered this encounter  Medications  . promethazine-dextromethorphan (PROMETHAZINE-DM) 6.25-15 MG/5ML syrup    Sig: Take 5 mLs by mouth 4 (four) times daily as needed for cough.     Dispense:  118 mL    Refill:  0  . oseltamivir (TAMIFLU) 75 MG capsule    Sig: Take 1 capsule (75 mg total) by mouth 2 (two) times daily.    Dispense:  10 capsule    Refill:  0     Follow-up: Return in about 2 weeks (around 07/02/2015).  Scarlette Calico, MD

## 2015-06-22 ENCOUNTER — Other Ambulatory Visit: Payer: Self-pay | Admitting: Internal Medicine

## 2015-06-22 ENCOUNTER — Other Ambulatory Visit: Payer: Self-pay

## 2015-06-22 ENCOUNTER — Ambulatory Visit: Payer: BLUE CROSS/BLUE SHIELD | Admitting: Internal Medicine

## 2015-06-22 MED ORDER — CEFDINIR 300 MG PO CAPS: 300.0000 mg | ORAL_CAPSULE | Freq: Two times a day (BID) | ORAL | Status: DC

## 2015-06-22 NOTE — Progress Notes (Signed)
Yes, complete the tamiflu

## 2015-06-22 NOTE — Addendum Note (Signed)
Addended by: Aviva Signs M on: 06/22/2015 02:54 PM   Modules accepted: Orders

## 2015-07-02 ENCOUNTER — Ambulatory Visit (INDEPENDENT_AMBULATORY_CARE_PROVIDER_SITE_OTHER): Payer: BLUE CROSS/BLUE SHIELD | Admitting: Internal Medicine

## 2015-07-02 ENCOUNTER — Encounter: Payer: Self-pay | Admitting: Internal Medicine

## 2015-07-02 VITALS — BP 118/82 | HR 74 | Temp 98.0°F | Resp 16 | Ht 70.0 in | Wt 148.0 lb

## 2015-07-02 DIAGNOSIS — J111 Influenza due to unidentified influenza virus with other respiratory manifestations: Secondary | ICD-10-CM | POA: Diagnosis not present

## 2015-07-02 NOTE — Progress Notes (Signed)
Subjective:  Patient ID: Jeffrey Reilly, male    DOB: 1980-05-26  Age: 35 y.o. MRN: [id number]  CC: URI   HPI Jeffrey Reilly presents for follow-up after a recent influenza illness with bacterial upper respiratory infection. He is feeling much better and tolerating the antibiotics with no complications. He states he has about 6 loose bowel movements a day but no abdominal pain, cramping, bloody stool, or mucus in his stools. His cough is almost completely resolved. He very rarely has a mild nonproductive cough. He denies fevers, chills, night sweats, shortness of breath.  Outpatient Prescriptions Prior to Visit  Medication Sig Dispense Refill  . cefdinir (OMNICEF) 300 MG capsule Take 1 capsule (300 mg total) by mouth 2 (two) times daily. Finish tamiflu as prescribed. 20 capsule 1  . inFLIXimab (REMICADE) 100 MG injection Inject 300 mg at 0 wk, 2 wk, 6 wk, and 8 wk intervals  To be infused at [facility] for benadryl 50 mg and tylenol 500 mg prior to injection 1 each 1  . Probiotic Product (PROBIOTIC DAILY PO) Take by mouth. 2 caps daily    . promethazine-dextromethorphan (PROMETHAZINE-DM) 6.25-15 MG/5ML syrup Take 5 mLs by mouth 4 (four) times daily as needed for cough. 118 mL 0   No facility-administered medications prior to visit.    ROS Review of Systems  Constitutional: Negative.  Negative for fever, chills, diaphoresis, appetite change and fatigue.  HENT: Negative.  Negative for facial swelling, sinus pressure and sore throat.   Eyes: Negative.   Respiratory: Positive for cough. Negative for choking, chest tightness, shortness of breath and stridor.   Cardiovascular: Negative.  Negative for chest pain, palpitations and leg swelling.  Gastrointestinal: Negative.  Negative for nausea, vomiting, abdominal pain and constipation.  Endocrine: Negative.   Genitourinary: Negative.   Musculoskeletal: Negative.   Skin: Negative.   Allergic/Immunologic: Negative.   Neurological:  Negative.  Negative for dizziness, tremors, weakness, light-headedness, numbness and headaches.  Hematological: Negative.  Negative for adenopathy. Does not bruise/bleed easily.  Psychiatric/Behavioral: Negative.     Objective:  BP 118/82 mmHg  Pulse 74  Temp(Src) 98 F (36.7 C) (Oral)  Resp 16  Ht 5' 10"  (1.778 m)  Wt 148 lb (67.132 kg)  BMI 21.24 kg/m2  SpO2 97%  BP Readings from Last 3 Encounters:  07/02/15 118/82  06/18/15 120/76  06/03/15 124/70    Wt Readings from Last 3 Encounters:  07/02/15 148 lb (67.132 kg)  06/18/15 153 lb (69.4 kg)  06/03/15 154 lb 5.2 oz (70 kg)    Physical Exam  Constitutional: He is oriented to person, place, and time. No distress.  HENT:  Head: Normocephalic and atraumatic.  Mouth/Throat: Oropharynx is clear and moist. No oropharyngeal exudate.  Eyes: Conjunctivae are normal. Right eye exhibits no discharge. Left eye exhibits no discharge. No scleral icterus.  Neck: Normal range of motion. Neck supple. No JVD present. No tracheal deviation present. No thyromegaly present.  Cardiovascular: Normal rate, regular rhythm, normal heart sounds and intact distal pulses.  Exam reveals no gallop and no friction rub.   No murmur heard. Pulmonary/Chest: Effort normal and breath sounds normal. No stridor. No respiratory distress. He has no wheezes. He has no rales. He exhibits no tenderness.  Abdominal: Soft. Bowel sounds are normal. He exhibits no distension and no mass. There is no tenderness. There is no rebound and no guarding.  Musculoskeletal: Normal range of motion. He exhibits no edema or tenderness.  Lymphadenopathy:  He has no cervical adenopathy.  Neurological: He is oriented to person, place, and time.  Skin: Skin is warm and dry. No rash noted. He is not diaphoretic. No erythema. No pallor.  Vitals reviewed.   Lab Results  Component Value Date   WBC 4.1 06/18/2015   HGB 18.0 Repeated and verified X2.* 06/18/2015   HCT 51.4  06/18/2015   PLT 128.0* 06/18/2015   GLUCOSE 105* 06/18/2015   ALT 25 06/03/2015   AST 33 06/03/2015   NA 138 06/18/2015   K 4.2 06/18/2015   CL 101 06/18/2015   CREATININE 1.29 06/18/2015   BUN 19 06/18/2015   CO2 31 06/18/2015    Dg Chest 2 View  06/18/2015  CLINICAL DATA:  Cough and congestion. EXAM: CHEST  2 VIEW COMPARISON:  01/21/2015. FINDINGS: Mediastinum hilar structures are normal. Mild bilateral perihilar pulmonary interstitial prominence noted. Mild pneumonitis cannot be excluded. Heart size stable. No prominent pleural effusion stable mild pleural thickening most likely scarring. No pneumothorax. No acute bony abnormality . IMPRESSION: Mild bilateral pulmonary interstitial prominence. Mild pneumonitis cannot be excluded. Electronically Signed   By: Marcello Moores  Register   On: 06/18/2015 14:53    Assessment & Plan:   Jeffrey Reilly was seen today for uri.  Diagnoses and all orders for this visit:  Influenza with respiratory manifestation- this is resolved.  I have discontinued Mr. Jeffrey Reilly promethazine-dextromethorphan. I am also having him maintain his Probiotic Product (PROBIOTIC DAILY PO), inFLIXimab, and cefdinir.  No orders of the defined types were placed in this encounter.     Follow-up: No Follow-up on file.  Scarlette Calico, MD

## 2015-07-02 NOTE — Progress Notes (Signed)
Pre visit review using our clinic review tool, if applicable. No additional management support is needed unless otherwise documented below in the visit note. 

## 2015-07-05 NOTE — Patient Instructions (Signed)

## 2015-07-29 ENCOUNTER — Ambulatory Visit (HOSPITAL_COMMUNITY)
Admission: RE | Admit: 2015-07-29 | Discharge: 2015-07-29 | Disposition: A | Discharge status: Home / Self Care | Payer: BLUE CROSS/BLUE SHIELD | Source: Ambulatory Visit | Attending: Pediatrics | Admitting: Pediatrics

## 2015-07-29 DIAGNOSIS — K5000 Crohn's disease of small intestine without complications: Secondary | ICD-10-CM | POA: Insufficient documentation

## 2015-07-29 MED ORDER — SODIUM CHLORIDE 0.9 % IV SOLN
5.0000 mg/kg | INTRAVENOUS | Status: AC
  Administered 2015-07-29: 400 mg via INTRAVENOUS
  Filled 2015-07-29: qty 40

## 2015-07-29 MED ORDER — SODIUM CHLORIDE 0.9 % IV SOLN
INTRAVENOUS | Status: DC
  Administered 2015-07-29: 10:00:00 via INTRAVENOUS

## 2015-08-09 ENCOUNTER — Encounter: Payer: Self-pay | Admitting: Internal Medicine

## 2015-09-23 ENCOUNTER — Ambulatory Visit (HOSPITAL_COMMUNITY)
Admission: RE | Admit: 2015-09-23 | Discharge: 2015-09-23 | Disposition: A | Discharge status: Home / Self Care | Payer: BLUE CROSS/BLUE SHIELD | Source: Ambulatory Visit | Attending: Pediatrics | Admitting: Pediatrics

## 2015-09-23 DIAGNOSIS — K5090 Crohn's disease, unspecified, without complications: Secondary | ICD-10-CM | POA: Diagnosis not present

## 2015-09-23 DIAGNOSIS — K5000 Crohn's disease of small intestine without complications: Secondary | ICD-10-CM | POA: Diagnosis not present

## 2015-09-23 LAB — CBC WITH DIFFERENTIAL/PLATELET
Basophils Absolute: 0 10*3/uL (ref 0.0–0.1)
Basophils Relative: 0 %
Eosinophils Absolute: 0.2 10*3/uL (ref 0.0–0.7)
Eosinophils Relative: 2 %
HCT: 52.4 % — ABNORMAL HIGH (ref 39.0–52.0)
Hemoglobin: 17.9 g/dL — ABNORMAL HIGH (ref 13.0–17.0)
Lymphocytes Relative: 40 %
Lymphs Abs: 3.5 10*3/uL (ref 0.7–4.0)
MCH: 30.5 pg (ref 26.0–34.0)
MCHC: 34.2 g/dL (ref 30.0–36.0)
MCV: 89.3 fL (ref 78.0–100.0)
Monocytes Absolute: 0.5 10*3/uL (ref 0.1–1.0)
Monocytes Relative: 6 %
Neutro Abs: 4.5 10*3/uL (ref 1.7–7.7)
Neutrophils Relative %: 52 %
Platelets: 176 10*3/uL (ref 150–400)
RBC: 5.87 MIL/uL — ABNORMAL HIGH (ref 4.22–5.81)
RDW: 12.2 % (ref 11.5–15.5)
WBC: 8.7 10*3/uL (ref 4.0–10.5)

## 2015-09-23 LAB — COMPREHENSIVE METABOLIC PANEL
ALT: 17 U/L (ref 17–63)
AST: 25 U/L (ref 15–41)
Albumin: 4.2 g/dL (ref 3.5–5.0)
Alkaline Phosphatase: 21 U/L — ABNORMAL LOW (ref 38–126)
Anion gap: 6 (ref 5–15)
BUN: 17 mg/dL (ref 6–20)
CO2: 26 mmol/L (ref 22–32)
Calcium: 9.7 mg/dL (ref 8.9–10.3)
Chloride: 107 mmol/L (ref 101–111)
Creatinine, Ser: 1.25 mg/dL — ABNORMAL HIGH (ref 0.61–1.24)
GFR calc Af Amer: >60 mL/min (ref >60)
GFR calc non Af Amer: >60 mL/min (ref >60)
Glucose, Bld: 90 mg/dL (ref 65–99)
Potassium: 4.2 mmol/L (ref 3.5–5.1)
Sodium: 139 mmol/L (ref 135–145)
Total Bilirubin: 1.3 mg/dL — ABNORMAL HIGH (ref 0.3–1.2)
Total Protein: 6.9 g/dL (ref 6.5–8.1)

## 2015-09-23 LAB — SEDIMENTATION RATE: Sed Rate: 5 mm/hr (ref 0–16)

## 2015-09-23 LAB — C-REACTIVE PROTEIN: CRP: 1.1 mg/dL — ABNORMAL HIGH (ref <1.0)

## 2015-09-23 MED ORDER — ACETAMINOPHEN 325 MG PO TABS: 650.0000 mg | ORAL_TABLET | ORAL | Status: DC

## 2015-09-23 MED ORDER — LORATADINE 10 MG PO TABS: 10.0000 mg | ORAL_TABLET | ORAL | Status: DC

## 2015-09-23 MED ORDER — SODIUM CHLORIDE 0.9 % IV SOLN
INTRAVENOUS | Status: DC
  Administered 2015-09-23: 11:00:00 via INTRAVENOUS

## 2015-09-23 MED ORDER — SODIUM CHLORIDE 0.9 % IV SOLN
5.0000 mg/kg | INTRAVENOUS | Status: DC
  Administered 2015-09-23: 400 mg via INTRAVENOUS
  Filled 2015-09-23: qty 40

## 2015-11-17 ENCOUNTER — Other Ambulatory Visit (HOSPITAL_COMMUNITY): Payer: Self-pay | Admitting: *Deleted

## 2015-11-18 ENCOUNTER — Ambulatory Visit (HOSPITAL_COMMUNITY)
Admission: RE | Admit: 2015-11-18 | Discharge: 2015-11-18 | Disposition: A | Discharge status: Home / Self Care | Payer: BLUE CROSS/BLUE SHIELD | Source: Ambulatory Visit | Attending: Pediatrics | Admitting: Pediatrics

## 2015-11-18 DIAGNOSIS — K5090 Crohn's disease, unspecified, without complications: Secondary | ICD-10-CM | POA: Diagnosis not present

## 2015-11-18 LAB — IRON AND TIBC
Iron: 143 ug/dL (ref 45–182)
Saturation Ratios: 37 % (ref 17.9–39.5)
TIBC: 389 ug/dL (ref 250–450)
UIBC: 246 ug/dL

## 2015-11-18 LAB — VITAMIN B12: Vitamin B-12: 212 pg/mL (ref 180–914)

## 2015-11-18 LAB — FOLATE: Folate: 24.6 ng/mL (ref >5.9)

## 2015-11-18 MED ORDER — LORATADINE 10 MG PO TABS: 10.0000 mg | ORAL_TABLET | ORAL | Status: DC

## 2015-11-18 MED ORDER — SODIUM CHLORIDE 0.9 % IV SOLN
5.0000 mg/kg | INTRAVENOUS | Status: AC
  Administered 2015-11-18: 400 mg via INTRAVENOUS
  Filled 2015-11-18: qty 20

## 2015-11-18 MED ORDER — SODIUM CHLORIDE 0.9 % IV SOLN
INTRAVENOUS | Status: AC
  Administered 2015-11-18: 10:00:00 via INTRAVENOUS

## 2015-11-19 DIAGNOSIS — Q72812 Congenital shortening of left lower limb: Secondary | ICD-10-CM | POA: Diagnosis not present

## 2015-11-19 DIAGNOSIS — M9901 Segmental and somatic dysfunction of cervical region: Secondary | ICD-10-CM | POA: Diagnosis not present

## 2015-11-19 DIAGNOSIS — M9905 Segmental and somatic dysfunction of pelvic region: Secondary | ICD-10-CM | POA: Diagnosis not present

## 2015-11-19 DIAGNOSIS — M5381 Other specified dorsopathies, occipito-atlanto-axial region: Secondary | ICD-10-CM | POA: Diagnosis not present

## 2015-11-19 LAB — VITAMIN D 25 HYDROXY (VIT D DEFICIENCY, FRACTURES): Vit D, 25-Hydroxy: 42.3 ng/mL (ref 30.0–100.0)

## 2015-11-25 DIAGNOSIS — Z79899 Other long term (current) drug therapy: Secondary | ICD-10-CM | POA: Diagnosis not present

## 2015-11-25 DIAGNOSIS — K912 Postsurgical malabsorption, not elsewhere classified: Secondary | ICD-10-CM | POA: Diagnosis not present

## 2015-11-25 DIAGNOSIS — K5090 Crohn's disease, unspecified, without complications: Secondary | ICD-10-CM | POA: Diagnosis not present

## 2015-11-26 DIAGNOSIS — M9905 Segmental and somatic dysfunction of pelvic region: Secondary | ICD-10-CM | POA: Diagnosis not present

## 2015-11-26 DIAGNOSIS — M9901 Segmental and somatic dysfunction of cervical region: Secondary | ICD-10-CM | POA: Diagnosis not present

## 2015-11-26 DIAGNOSIS — M5381 Other specified dorsopathies, occipito-atlanto-axial region: Secondary | ICD-10-CM | POA: Diagnosis not present

## 2015-11-26 DIAGNOSIS — Q72812 Congenital shortening of left lower limb: Secondary | ICD-10-CM | POA: Diagnosis not present

## 2015-12-01 DIAGNOSIS — M5381 Other specified dorsopathies, occipito-atlanto-axial region: Secondary | ICD-10-CM | POA: Diagnosis not present

## 2015-12-01 DIAGNOSIS — M9901 Segmental and somatic dysfunction of cervical region: Secondary | ICD-10-CM | POA: Diagnosis not present

## 2015-12-01 DIAGNOSIS — Q72812 Congenital shortening of left lower limb: Secondary | ICD-10-CM | POA: Diagnosis not present

## 2015-12-03 DIAGNOSIS — M5381 Other specified dorsopathies, occipito-atlanto-axial region: Secondary | ICD-10-CM | POA: Diagnosis not present

## 2015-12-03 DIAGNOSIS — Q72812 Congenital shortening of left lower limb: Secondary | ICD-10-CM | POA: Diagnosis not present

## 2015-12-03 DIAGNOSIS — M9901 Segmental and somatic dysfunction of cervical region: Secondary | ICD-10-CM | POA: Diagnosis not present

## 2015-12-08 DIAGNOSIS — M5381 Other specified dorsopathies, occipito-atlanto-axial region: Secondary | ICD-10-CM | POA: Diagnosis not present

## 2015-12-08 DIAGNOSIS — M9905 Segmental and somatic dysfunction of pelvic region: Secondary | ICD-10-CM | POA: Diagnosis not present

## 2015-12-08 DIAGNOSIS — M9901 Segmental and somatic dysfunction of cervical region: Secondary | ICD-10-CM | POA: Diagnosis not present

## 2015-12-15 DIAGNOSIS — M9905 Segmental and somatic dysfunction of pelvic region: Secondary | ICD-10-CM | POA: Diagnosis not present

## 2015-12-15 DIAGNOSIS — M5381 Other specified dorsopathies, occipito-atlanto-axial region: Secondary | ICD-10-CM | POA: Diagnosis not present

## 2015-12-15 DIAGNOSIS — Q72812 Congenital shortening of left lower limb: Secondary | ICD-10-CM | POA: Diagnosis not present

## 2015-12-15 DIAGNOSIS — M9901 Segmental and somatic dysfunction of cervical region: Secondary | ICD-10-CM | POA: Diagnosis not present

## 2015-12-21 DIAGNOSIS — M5381 Other specified dorsopathies, occipito-atlanto-axial region: Secondary | ICD-10-CM | POA: Diagnosis not present

## 2015-12-21 DIAGNOSIS — Q72812 Congenital shortening of left lower limb: Secondary | ICD-10-CM | POA: Diagnosis not present

## 2015-12-21 DIAGNOSIS — M9901 Segmental and somatic dysfunction of cervical region: Secondary | ICD-10-CM | POA: Diagnosis not present

## 2015-12-21 DIAGNOSIS — M545 Low back pain: Secondary | ICD-10-CM | POA: Diagnosis not present

## 2015-12-21 DIAGNOSIS — M47819 Spondylosis without myelopathy or radiculopathy, site unspecified: Secondary | ICD-10-CM | POA: Diagnosis not present

## 2015-12-24 DIAGNOSIS — M9901 Segmental and somatic dysfunction of cervical region: Secondary | ICD-10-CM | POA: Diagnosis not present

## 2015-12-24 DIAGNOSIS — M5381 Other specified dorsopathies, occipito-atlanto-axial region: Secondary | ICD-10-CM | POA: Diagnosis not present

## 2015-12-24 DIAGNOSIS — Q72812 Congenital shortening of left lower limb: Secondary | ICD-10-CM | POA: Diagnosis not present

## 2015-12-24 DIAGNOSIS — M9905 Segmental and somatic dysfunction of pelvic region: Secondary | ICD-10-CM | POA: Diagnosis not present

## 2015-12-29 DIAGNOSIS — M5381 Other specified dorsopathies, occipito-atlanto-axial region: Secondary | ICD-10-CM | POA: Diagnosis not present

## 2015-12-29 DIAGNOSIS — M9905 Segmental and somatic dysfunction of pelvic region: Secondary | ICD-10-CM | POA: Diagnosis not present

## 2015-12-29 DIAGNOSIS — Q72812 Congenital shortening of left lower limb: Secondary | ICD-10-CM | POA: Diagnosis not present

## 2015-12-29 DIAGNOSIS — M9901 Segmental and somatic dysfunction of cervical region: Secondary | ICD-10-CM | POA: Diagnosis not present

## 2015-12-31 DIAGNOSIS — D2262 Melanocytic nevi of left upper limb, including shoulder: Secondary | ICD-10-CM | POA: Diagnosis not present

## 2015-12-31 DIAGNOSIS — D234 Other benign neoplasm of skin of scalp and neck: Secondary | ICD-10-CM | POA: Diagnosis not present

## 2015-12-31 DIAGNOSIS — L851 Acquired keratosis [keratoderma] palmaris et plantaris: Secondary | ICD-10-CM | POA: Diagnosis not present

## 2015-12-31 DIAGNOSIS — D225 Melanocytic nevi of trunk: Secondary | ICD-10-CM | POA: Diagnosis not present

## 2016-01-05 DIAGNOSIS — M9905 Segmental and somatic dysfunction of pelvic region: Secondary | ICD-10-CM | POA: Diagnosis not present

## 2016-01-05 DIAGNOSIS — M5381 Other specified dorsopathies, occipito-atlanto-axial region: Secondary | ICD-10-CM | POA: Diagnosis not present

## 2016-01-05 DIAGNOSIS — Q72812 Congenital shortening of left lower limb: Secondary | ICD-10-CM | POA: Diagnosis not present

## 2016-01-05 DIAGNOSIS — M9901 Segmental and somatic dysfunction of cervical region: Secondary | ICD-10-CM | POA: Diagnosis not present

## 2016-01-12 DIAGNOSIS — M9905 Segmental and somatic dysfunction of pelvic region: Secondary | ICD-10-CM | POA: Diagnosis not present

## 2016-01-12 DIAGNOSIS — Q72812 Congenital shortening of left lower limb: Secondary | ICD-10-CM | POA: Diagnosis not present

## 2016-01-12 DIAGNOSIS — M5381 Other specified dorsopathies, occipito-atlanto-axial region: Secondary | ICD-10-CM | POA: Diagnosis not present

## 2016-01-12 DIAGNOSIS — M9901 Segmental and somatic dysfunction of cervical region: Secondary | ICD-10-CM | POA: Diagnosis not present

## 2016-01-13 ENCOUNTER — Ambulatory Visit (HOSPITAL_COMMUNITY)
Admission: RE | Admit: 2016-01-13 | Discharge: 2016-01-13 | Disposition: A | Discharge status: Home / Self Care | Payer: BLUE CROSS/BLUE SHIELD | Source: Ambulatory Visit | Attending: Pediatrics | Admitting: Pediatrics

## 2016-01-13 DIAGNOSIS — K5090 Crohn's disease, unspecified, without complications: Secondary | ICD-10-CM | POA: Insufficient documentation

## 2016-01-13 LAB — CBC WITH DIFFERENTIAL/PLATELET
Basophils Absolute: 0 10*3/uL (ref 0.0–0.1)
Basophils Relative: 0 %
Eosinophils Absolute: 0.1 10*3/uL (ref 0.0–0.7)
Eosinophils Relative: 2 %
HCT: 48.7 % (ref 39.0–52.0)
Hemoglobin: 16.8 g/dL (ref 13.0–17.0)
Lymphocytes Relative: 37 %
Lymphs Abs: 2.9 10*3/uL (ref 0.7–4.0)
MCH: 30.4 pg (ref 26.0–34.0)
MCHC: 34.5 g/dL (ref 30.0–36.0)
MCV: 88.1 fL (ref 78.0–100.0)
Monocytes Absolute: 0.6 10*3/uL (ref 0.1–1.0)
Monocytes Relative: 7 %
Neutro Abs: 4.1 10*3/uL (ref 1.7–7.7)
Neutrophils Relative %: 54 %
Platelets: 162 10*3/uL (ref 150–400)
RBC: 5.53 MIL/uL (ref 4.22–5.81)
RDW: 12.2 % (ref 11.5–15.5)
WBC: 7.7 10*3/uL (ref 4.0–10.5)

## 2016-01-13 LAB — HEPATIC FUNCTION PANEL
ALT: 19 U/L (ref 17–63)
AST: 25 U/L (ref 15–41)
Albumin: 4.4 g/dL (ref 3.5–5.0)
Alkaline Phosphatase: 37 U/L — ABNORMAL LOW (ref 38–126)
Bilirubin, Direct: 0.3 mg/dL (ref 0.1–0.5)
Indirect Bilirubin: 1.1 mg/dL — ABNORMAL HIGH (ref 0.3–0.9)
Total Bilirubin: 1.4 mg/dL — ABNORMAL HIGH (ref 0.3–1.2)
Total Protein: 6.8 g/dL (ref 6.5–8.1)

## 2016-01-13 LAB — C-REACTIVE PROTEIN: CRP: 1.1 mg/dL — ABNORMAL HIGH (ref <1.0)

## 2016-01-13 LAB — SEDIMENTATION RATE: Sed Rate: 1 mm/hr (ref 0–16)

## 2016-01-13 MED ORDER — SODIUM CHLORIDE 0.9 % IV SOLN
INTRAVENOUS | Status: AC
  Administered 2016-01-13: 11:00:00 via INTRAVENOUS

## 2016-01-13 MED ORDER — SODIUM CHLORIDE 0.9 % IV SOLN
5.0000 mg/kg | INTRAVENOUS | Status: AC
  Administered 2016-01-13: 400 mg via INTRAVENOUS
  Filled 2016-01-13: qty 40

## 2016-01-21 ENCOUNTER — Ambulatory Visit (INDEPENDENT_AMBULATORY_CARE_PROVIDER_SITE_OTHER): Payer: BLUE CROSS/BLUE SHIELD

## 2016-01-21 DIAGNOSIS — Z23 Encounter for immunization: Secondary | ICD-10-CM

## 2016-01-26 DIAGNOSIS — M9901 Segmental and somatic dysfunction of cervical region: Secondary | ICD-10-CM | POA: Diagnosis not present

## 2016-01-26 DIAGNOSIS — M9905 Segmental and somatic dysfunction of pelvic region: Secondary | ICD-10-CM | POA: Diagnosis not present

## 2016-01-26 DIAGNOSIS — M5381 Other specified dorsopathies, occipito-atlanto-axial region: Secondary | ICD-10-CM | POA: Diagnosis not present

## 2016-01-26 DIAGNOSIS — Q72812 Congenital shortening of left lower limb: Secondary | ICD-10-CM | POA: Diagnosis not present

## 2016-02-04 DIAGNOSIS — M9901 Segmental and somatic dysfunction of cervical region: Secondary | ICD-10-CM | POA: Diagnosis not present

## 2016-02-04 DIAGNOSIS — Q72812 Congenital shortening of left lower limb: Secondary | ICD-10-CM | POA: Diagnosis not present

## 2016-02-04 DIAGNOSIS — M9905 Segmental and somatic dysfunction of pelvic region: Secondary | ICD-10-CM | POA: Diagnosis not present

## 2016-02-04 DIAGNOSIS — M5381 Other specified dorsopathies, occipito-atlanto-axial region: Secondary | ICD-10-CM | POA: Diagnosis not present

## 2016-02-09 DIAGNOSIS — Q72812 Congenital shortening of left lower limb: Secondary | ICD-10-CM | POA: Diagnosis not present

## 2016-02-09 DIAGNOSIS — M5381 Other specified dorsopathies, occipito-atlanto-axial region: Secondary | ICD-10-CM | POA: Diagnosis not present

## 2016-02-09 DIAGNOSIS — M9901 Segmental and somatic dysfunction of cervical region: Secondary | ICD-10-CM | POA: Diagnosis not present

## 2016-02-09 DIAGNOSIS — M9905 Segmental and somatic dysfunction of pelvic region: Secondary | ICD-10-CM | POA: Diagnosis not present

## 2016-02-16 DIAGNOSIS — M5381 Other specified dorsopathies, occipito-atlanto-axial region: Secondary | ICD-10-CM | POA: Diagnosis not present

## 2016-02-16 DIAGNOSIS — M9905 Segmental and somatic dysfunction of pelvic region: Secondary | ICD-10-CM | POA: Diagnosis not present

## 2016-02-16 DIAGNOSIS — Q72812 Congenital shortening of left lower limb: Secondary | ICD-10-CM | POA: Diagnosis not present

## 2016-02-16 DIAGNOSIS — M9901 Segmental and somatic dysfunction of cervical region: Secondary | ICD-10-CM | POA: Diagnosis not present

## 2016-03-01 DIAGNOSIS — M9905 Segmental and somatic dysfunction of pelvic region: Secondary | ICD-10-CM | POA: Diagnosis not present

## 2016-03-01 DIAGNOSIS — Q72812 Congenital shortening of left lower limb: Secondary | ICD-10-CM | POA: Diagnosis not present

## 2016-03-01 DIAGNOSIS — M9901 Segmental and somatic dysfunction of cervical region: Secondary | ICD-10-CM | POA: Diagnosis not present

## 2016-03-01 DIAGNOSIS — M5381 Other specified dorsopathies, occipito-atlanto-axial region: Secondary | ICD-10-CM | POA: Diagnosis not present

## 2016-03-09 ENCOUNTER — Ambulatory Visit (HOSPITAL_COMMUNITY)
Admission: RE | Admit: 2016-03-09 | Discharge: 2016-03-09 | Disposition: A | Discharge status: Home / Self Care | Payer: BLUE CROSS/BLUE SHIELD | Source: Ambulatory Visit | Attending: Pediatrics | Admitting: Pediatrics

## 2016-03-09 DIAGNOSIS — K5090 Crohn's disease, unspecified, without complications: Secondary | ICD-10-CM | POA: Diagnosis not present

## 2016-03-09 LAB — CBC WITH DIFFERENTIAL/PLATELET
Basophils Absolute: 0 10*3/uL (ref 0.0–0.1)
Basophils Relative: 0 %
Eosinophils Absolute: 0.1 10*3/uL (ref 0.0–0.7)
Eosinophils Relative: 2 %
HCT: 50.3 % (ref 39.0–52.0)
Hemoglobin: 17.3 g/dL — ABNORMAL HIGH (ref 13.0–17.0)
Lymphocytes Relative: 41 %
Lymphs Abs: 2.8 10*3/uL (ref 0.7–4.0)
MCH: 30.6 pg (ref 26.0–34.0)
MCHC: 34.4 g/dL (ref 30.0–36.0)
MCV: 88.9 fL (ref 78.0–100.0)
Monocytes Absolute: 0.4 10*3/uL (ref 0.1–1.0)
Monocytes Relative: 6 %
Neutro Abs: 3.5 10*3/uL (ref 1.7–7.7)
Neutrophils Relative %: 51 %
Platelets: 164 10*3/uL (ref 150–400)
RBC: 5.66 MIL/uL (ref 4.22–5.81)
RDW: 12.3 % (ref 11.5–15.5)
WBC: 6.8 10*3/uL (ref 4.0–10.5)

## 2016-03-09 LAB — HEPATIC FUNCTION PANEL
ALT: 19 U/L (ref 17–63)
AST: 25 U/L (ref 15–41)
Albumin: 4.5 g/dL (ref 3.5–5.0)
Alkaline Phosphatase: 45 U/L (ref 38–126)
Bilirubin, Direct: 0.2 mg/dL (ref 0.1–0.5)
Indirect Bilirubin: 1 mg/dL — ABNORMAL HIGH (ref 0.3–0.9)
Total Bilirubin: 1.2 mg/dL (ref 0.3–1.2)
Total Protein: 7.1 g/dL (ref 6.5–8.1)

## 2016-03-09 LAB — C-REACTIVE PROTEIN: CRP: <0.8 mg/dL (ref <1.0)

## 2016-03-09 LAB — SEDIMENTATION RATE: Sed Rate: 1 mm/hr (ref 0–16)

## 2016-03-09 MED ORDER — SODIUM CHLORIDE 0.9 % IV SOLN
INTRAVENOUS | Status: AC
  Administered 2016-03-09: 10:00:00 via INTRAVENOUS

## 2016-03-09 MED ORDER — SODIUM CHLORIDE 0.9 % IV SOLN
5.0000 mg/kg | INTRAVENOUS | Status: AC
  Administered 2016-03-09: 400 mg via INTRAVENOUS
  Filled 2016-03-09: qty 40

## 2016-03-22 ENCOUNTER — Encounter: Payer: Self-pay | Admitting: Nurse Practitioner

## 2016-03-22 ENCOUNTER — Ambulatory Visit (INDEPENDENT_AMBULATORY_CARE_PROVIDER_SITE_OTHER): Payer: BLUE CROSS/BLUE SHIELD | Admitting: Nurse Practitioner

## 2016-03-22 VITALS — BP 132/74 | HR 80 | Temp 98.1°F | Ht 70.0 in | Wt 153.0 lb

## 2016-03-22 DIAGNOSIS — J069 Acute upper respiratory infection, unspecified: Secondary | ICD-10-CM | POA: Diagnosis not present

## 2016-03-22 MED ORDER — CEFUROXIME AXETIL 500 MG PO TABS: 500.0000 mg | ORAL_TABLET | Freq: Two times a day (BID) | ORAL | 0 refills | Status: DC

## 2016-03-22 MED ORDER — FLUTICASONE PROPIONATE 50 MCG/ACT NA SUSP: 2.0000 | Freq: Every day | NASAL | 0 refills | Status: DC

## 2016-03-22 MED ORDER — GUAIFENESIN ER 600 MG PO TB12: 600.0000 mg | ORAL_TABLET | Freq: Two times a day (BID) | ORAL | 0 refills | Status: DC | PRN

## 2016-03-22 MED ORDER — OXYMETAZOLINE HCL 0.05 % NA SOLN: 1.0000 | Freq: Two times a day (BID) | NASAL | 0 refills | Status: DC

## 2016-03-22 NOTE — Patient Instructions (Signed)
URI Instructions: Flonase and Afrin use: apply 1spray of afrin in each nare, wait 65mns, then apply 2sprays of flonase in each nare. Use both nasal spray consecutively x 3days, then flonase only for at least 14days.  Encourage adequate oral hydration.  Use over-the-counter  "cold" medicines  such as "Tylenol cold" , "Advil cold",  "Mucinex" or" Mucinex D"  for cough and congestion.  Avoid decongestants if you have high blood pressure. Use" Delsym" or" Robitussin" cough syrup varietis for cough.  You can use plain "Tylenol" or "Advi"l for fever, chills and achyness.   "Common cold" symptoms are usually triggered by a virus.  The antibiotics are usually not necessary. On average, a" viral cold" illness would take 4-7 days to resolve. Please, make an appointment if you are not better or if you're worse.   Start oral abx if no improvement by Friday

## 2016-03-22 NOTE — Progress Notes (Signed)
Subjective:  Patient ID: Jeffrey Reilly, male    DOB: [birth year]  Age: 36 y.o. MRN: [id number]  CC: Cough (coughing,slight congestion for 2-3 days.)   URI   This is a new problem. The current episode started yesterday. The problem has been unchanged. There has been no fever. Associated symptoms include congestion, rhinorrhea and a sore throat. Pertinent negatives include no abdominal pain, chest pain, coughing, diarrhea, dysuria, ear pain, headaches, joint pain, joint swelling, nausea, neck pain, plugged ear sensation, rash, sinus pain, sneezing, swollen glands, vomiting or wheezing. He has tried nothing for the symptoms.  contact with sick wife. She was diagnosed with pink eye and sinus infection. Contact with sick niece over christmas with pink eye and URI. Current use of remicade hence concerned about immune response.  Outpatient Medications Prior to Visit  Medication Sig Dispense Refill  . inFLIXimab (REMICADE) 100 MG injection Inject 300 mg at 0 wk, 2 wk, 6 wk, and 8 wk intervals  To be infused at [facility] for benadryl 50 mg and tylenol 500 mg prior to injection 1 each 1  . Probiotic Product (PROBIOTIC DAILY PO) Take by mouth. 2 caps daily    . cefdinir (OMNICEF) 300 MG capsule Take 1 capsule (300 mg total) by mouth 2 (two) times daily. Finish tamiflu as prescribed. (Patient not taking: Reported on 03/22/2016) 20 capsule 1   No facility-administered medications prior to visit.     ROS See HPI  Objective:  BP 132/74   Pulse 80   Temp 98.1 F (36.7 C)   Ht 5' 10"  (1.778 m)   Wt 153 lb (69.4 kg)   SpO2 99%   BMI 21.95 kg/m   BP Readings from Last 3 Encounters:  03/22/16 132/74  03/09/16 114/72  01/13/16 111/72    Wt Readings from Last 3 Encounters:  03/22/16 153 lb (69.4 kg)  03/09/16 154 lb 5.2 oz (70 kg)  01/13/16 152 lb 1.9 oz (69 kg)    Physical Exam  Constitutional: He is oriented to person, place, and time. No distress.  HENT:  Right Ear:  Tympanic membrane, external ear and ear canal normal.  Left Ear: Tympanic membrane and ear canal normal.  Nose: Mucosal edema and rhinorrhea present. Right sinus exhibits no maxillary sinus tenderness and no frontal sinus tenderness. Left sinus exhibits no maxillary sinus tenderness and no frontal sinus tenderness.  Mouth/Throat: Uvula is midline. Posterior oropharyngeal erythema present. No oropharyngeal exudate.  Eyes: No scleral icterus.  Neck: Normal range of motion. Neck supple.  Cardiovascular: Normal rate and regular rhythm.   Pulmonary/Chest: Effort normal and breath sounds normal.  Lymphadenopathy:    He has no cervical adenopathy.  Neurological: He is alert and oriented to person, place, and time.  Vitals reviewed.   Lab Results  Component Value Date   WBC 6.8 03/09/2016   HGB 17.3 (H) 03/09/2016   HCT 50.3 03/09/2016   PLT 164 03/09/2016   GLUCOSE 90 09/23/2015   ALT 19 03/09/2016   AST 25 03/09/2016   NA 139 09/23/2015   K 4.2 09/23/2015   CL 107 09/23/2015   CREATININE 1.25 (H) 09/23/2015   BUN 17 09/23/2015   CO2 26 09/23/2015    No results found.  Assessment & Plan:   Jeffrey Reilly was seen today for cough.  Diagnoses and all orders for this visit:  Acute URI -     fluticasone (FLONASE) 50 MCG/ACT nasal spray; Place 2 sprays into both nostrils daily. -  oxymetazoline (AFRIN NASAL SPRAY) 0.05 % nasal spray; Place 1 spray into both nostrils 2 (two) times daily. Use only for 3days, then stop -     guaiFENesin (MUCINEX) 600 MG 12 hr tablet; Take 1 tablet (600 mg total) by mouth 2 (two) times daily as needed for cough or to loosen phlegm. -     cefUROXime (CEFTIN) 500 MG tablet; Take 1 tablet (500 mg total) by mouth 2 (two) times daily with a meal.   I have discontinued Jeffrey Reilly cefdinir. I am also having him start on fluticasone, oxymetazoline, guaiFENesin, and cefUROXime. Additionally, I am having him maintain his Probiotic Product (PROBIOTIC DAILY PO) and  inFLIXimab.  Meds ordered this encounter  Medications  . fluticasone (FLONASE) 50 MCG/ACT nasal spray    Sig: Place 2 sprays into both nostrils daily.    Dispense:  16 g    Refill:  0    Order Specific Question:   Supervising Provider    Answer:   Cassandria Anger [[id number]]  . oxymetazoline (AFRIN NASAL SPRAY) 0.05 % nasal spray    Sig: Place 1 spray into both nostrils 2 (two) times daily. Use only for 3days, then stop    Dispense:  30 mL    Refill:  0    Order Specific Question:   Supervising Provider    Answer:   Cassandria Anger [[id number]]  . guaiFENesin (MUCINEX) 600 MG 12 hr tablet    Sig: Take 1 tablet (600 mg total) by mouth 2 (two) times daily as needed for cough or to loosen phlegm.    Dispense:  14 tablet    Refill:  0    Order Specific Question:   Supervising Provider    Answer:   Cassandria Anger [[id number]]  . cefUROXime (CEFTIN) 500 MG tablet    Sig: Take 1 tablet (500 mg total) by mouth 2 (two) times daily with a meal.    Dispense:  14 tablet    Refill:  0    Order Specific Question:   Supervising Provider    Answer:   Cassandria Anger [[id number]]    Follow-up: Return if symptoms worsen or fail to improve.  Wilfred Lacy, NP

## 2016-03-22 NOTE — Progress Notes (Signed)
Pre visit review using our clinic review tool, if applicable. No additional management support is needed unless otherwise documented below in the visit note. 

## 2016-03-24 ENCOUNTER — Telehealth: Payer: Self-pay | Admitting: Internal Medicine

## 2016-03-24 NOTE — Telephone Encounter (Signed)
Patient Name: Jeffrey Reilly  DOB: 1980/07/25    Initial Comment Caller has a fever that wont break. It's about 102.   Nurse Assessment      Guidelines    Guideline Title Affirmed Question Affirmed Notes       Final Disposition User   FINAL ATTEMPT MADE - message left Raphael Gibney, Therapist, sports, Vanita Ingles

## 2016-05-03 ENCOUNTER — Other Ambulatory Visit (HOSPITAL_COMMUNITY): Payer: Self-pay | Admitting: *Deleted

## 2016-05-04 ENCOUNTER — Ambulatory Visit (HOSPITAL_COMMUNITY)
Admission: RE | Admit: 2016-05-04 | Discharge: 2016-05-04 | Disposition: A | Discharge status: Home / Self Care | Payer: BLUE CROSS/BLUE SHIELD | Source: Ambulatory Visit | Attending: Pediatrics | Admitting: Pediatrics

## 2016-05-04 DIAGNOSIS — K5090 Crohn's disease, unspecified, without complications: Secondary | ICD-10-CM | POA: Insufficient documentation

## 2016-05-04 MED ORDER — SODIUM CHLORIDE 0.9 % IV SOLN
5.0000 mg/kg | INTRAVENOUS | Status: DC
  Administered 2016-05-04: 300 mg via INTRAVENOUS
  Filled 2016-05-04: qty 30

## 2016-05-04 MED ORDER — SODIUM CHLORIDE 0.9 % IV SOLN
INTRAVENOUS | Status: DC
  Administered 2016-05-04: 10:00:00 via INTRAVENOUS

## 2016-05-04 MED ORDER — LORATADINE 10 MG PO TABS: 10.0000 mg | ORAL_TABLET | Freq: Every day | ORAL | Status: DC

## 2016-05-04 MED ORDER — ACETAMINOPHEN 325 MG PO TABS: 650.0000 mg | ORAL_TABLET | ORAL | Status: DC

## 2016-06-01 DIAGNOSIS — K5000 Crohn's disease of small intestine without complications: Secondary | ICD-10-CM | POA: Diagnosis not present

## 2016-06-01 DIAGNOSIS — Z79899 Other long term (current) drug therapy: Secondary | ICD-10-CM | POA: Diagnosis not present

## 2016-06-01 DIAGNOSIS — K912 Postsurgical malabsorption, not elsewhere classified: Secondary | ICD-10-CM | POA: Diagnosis not present

## 2016-06-28 ENCOUNTER — Other Ambulatory Visit (HOSPITAL_COMMUNITY): Payer: Self-pay | Admitting: *Deleted

## 2016-06-29 ENCOUNTER — Ambulatory Visit (HOSPITAL_COMMUNITY)
Admission: RE | Admit: 2016-06-29 | Discharge: 2016-06-29 | Disposition: A | Discharge status: Home / Self Care | Payer: BLUE CROSS/BLUE SHIELD | Source: Ambulatory Visit | Attending: Pediatrics | Admitting: Pediatrics

## 2016-06-29 DIAGNOSIS — K5090 Crohn's disease, unspecified, without complications: Secondary | ICD-10-CM | POA: Diagnosis not present

## 2016-06-29 LAB — CBC
HCT: 48.9 % (ref 39.0–52.0)
Hemoglobin: 16.6 g/dL (ref 13.0–17.0)
MCH: 30.1 pg (ref 26.0–34.0)
MCHC: 33.9 g/dL (ref 30.0–36.0)
MCV: 88.7 fL (ref 78.0–100.0)
Platelets: 155 10*3/uL (ref 150–400)
RBC: 5.51 MIL/uL (ref 4.22–5.81)
RDW: 13.1 % (ref 11.5–15.5)
WBC: 8.2 10*3/uL (ref 4.0–10.5)

## 2016-06-29 LAB — HEPATIC FUNCTION PANEL
ALT: 23 U/L (ref 17–63)
AST: 26 U/L (ref 15–41)
Albumin: 4.4 g/dL (ref 3.5–5.0)
Alkaline Phosphatase: 43 U/L (ref 38–126)
Bilirubin, Direct: 0.3 mg/dL (ref 0.1–0.5)
Indirect Bilirubin: 1.1 mg/dL — ABNORMAL HIGH (ref 0.3–0.9)
Total Bilirubin: 1.4 mg/dL — ABNORMAL HIGH (ref 0.3–1.2)
Total Protein: 6.9 g/dL (ref 6.5–8.1)

## 2016-06-29 LAB — SEDIMENTATION RATE: Sed Rate: 1 mm/hr (ref 0–16)

## 2016-06-29 MED ORDER — ACETAMINOPHEN 325 MG PO TABS: 650.0000 mg | ORAL_TABLET | ORAL | Status: DC

## 2016-06-29 MED ORDER — LORATADINE 10 MG PO TABS: 10.0000 mg | ORAL_TABLET | Freq: Every day | ORAL | Status: DC

## 2016-06-29 MED ORDER — SODIUM CHLORIDE 0.9 % IV SOLN
5.0000 mg/kg | INTRAVENOUS | Status: AC
  Administered 2016-06-29: 300 mg via INTRAVENOUS
  Filled 2016-06-29: qty 30

## 2016-06-29 MED ORDER — SODIUM CHLORIDE 0.9 % IV SOLN
INTRAVENOUS | Status: DC
  Administered 2016-06-29: 10:00:00 via INTRAVENOUS

## 2016-08-24 ENCOUNTER — Ambulatory Visit (HOSPITAL_COMMUNITY)
Admission: RE | Admit: 2016-08-24 | Discharge: 2016-08-24 | Disposition: A | Discharge status: Home / Self Care | Payer: BLUE CROSS/BLUE SHIELD | Source: Ambulatory Visit | Attending: Pediatrics | Admitting: Pediatrics

## 2016-08-24 DIAGNOSIS — K5090 Crohn's disease, unspecified, without complications: Secondary | ICD-10-CM | POA: Insufficient documentation

## 2016-08-24 LAB — CBC
HCT: 47.9 % (ref 39.0–52.0)
Hemoglobin: 16.3 g/dL (ref 13.0–17.0)
MCH: 30.6 pg (ref 26.0–34.0)
MCHC: 34 g/dL (ref 30.0–36.0)
MCV: 89.9 fL (ref 78.0–100.0)
Platelets: 156 10*3/uL (ref 150–400)
RBC: 5.33 MIL/uL (ref 4.22–5.81)
RDW: 12.4 % (ref 11.5–15.5)
WBC: 7.3 10*3/uL (ref 4.0–10.5)

## 2016-08-24 LAB — HEPATIC FUNCTION PANEL
ALT: 17 U/L (ref 17–63)
AST: 24 U/L (ref 15–41)
Albumin: 4.5 g/dL (ref 3.5–5.0)
Alkaline Phosphatase: 40 U/L (ref 38–126)
Bilirubin, Direct: 0.4 mg/dL (ref 0.1–0.5)
Indirect Bilirubin: 1.4 mg/dL — ABNORMAL HIGH (ref 0.3–0.9)
Total Bilirubin: 1.8 mg/dL — ABNORMAL HIGH (ref 0.3–1.2)
Total Protein: 6.8 g/dL (ref 6.5–8.1)

## 2016-08-24 LAB — SEDIMENTATION RATE: Sed Rate: 1 mm/hr (ref 0–16)

## 2016-08-24 MED ORDER — ACETAMINOPHEN 325 MG PO TABS: 650.0000 mg | ORAL_TABLET | Freq: Once | ORAL | Status: DC

## 2016-08-24 MED ORDER — SODIUM CHLORIDE 0.9 % IV SOLN
INTRAVENOUS | Status: DC
  Administered 2016-08-24: 10:00:00 via INTRAVENOUS

## 2016-08-24 MED ORDER — SODIUM CHLORIDE 0.9 % IV SOLN
5.0000 mg/kg | INTRAVENOUS | Status: DC
  Administered 2016-08-24: 300 mg via INTRAVENOUS
  Filled 2016-08-24: qty 30

## 2016-08-24 MED ORDER — LORATADINE 10 MG PO TABS: 10.0000 mg | ORAL_TABLET | Freq: Once | ORAL | Status: DC

## 2016-10-19 DIAGNOSIS — K5090 Crohn's disease, unspecified, without complications: Secondary | ICD-10-CM | POA: Diagnosis not present

## 2016-12-14 DIAGNOSIS — K912 Postsurgical malabsorption, not elsewhere classified: Secondary | ICD-10-CM | POA: Diagnosis not present

## 2016-12-14 DIAGNOSIS — Z79899 Other long term (current) drug therapy: Secondary | ICD-10-CM | POA: Diagnosis not present

## 2016-12-14 DIAGNOSIS — K5090 Crohn's disease, unspecified, without complications: Secondary | ICD-10-CM | POA: Diagnosis not present

## 2016-12-14 DIAGNOSIS — K5000 Crohn's disease of small intestine without complications: Secondary | ICD-10-CM | POA: Diagnosis not present

## 2017-01-05 DIAGNOSIS — D225 Melanocytic nevi of trunk: Secondary | ICD-10-CM | POA: Diagnosis not present

## 2017-01-05 DIAGNOSIS — D224 Melanocytic nevi of scalp and neck: Secondary | ICD-10-CM | POA: Diagnosis not present

## 2017-01-12 ENCOUNTER — Ambulatory Visit (INDEPENDENT_AMBULATORY_CARE_PROVIDER_SITE_OTHER): Payer: BLUE CROSS/BLUE SHIELD | Admitting: General Practice

## 2017-01-12 DIAGNOSIS — Z23 Encounter for immunization: Secondary | ICD-10-CM

## 2017-02-02 DIAGNOSIS — Z79899 Other long term (current) drug therapy: Secondary | ICD-10-CM | POA: Diagnosis not present

## 2017-02-02 DIAGNOSIS — K5090 Crohn's disease, unspecified, without complications: Secondary | ICD-10-CM | POA: Diagnosis not present

## 2017-02-08 DIAGNOSIS — K5000 Crohn's disease of small intestine without complications: Secondary | ICD-10-CM | POA: Diagnosis not present

## 2017-03-27 IMAGING — DX DG CHEST 2V
2 series · 2 of 2 positions shown · non-contrast
Comparison: 01/21/2015.

CLINICAL DATA: Cough and congestion.

EXAM:
CHEST  2 VIEW

[chest pa]
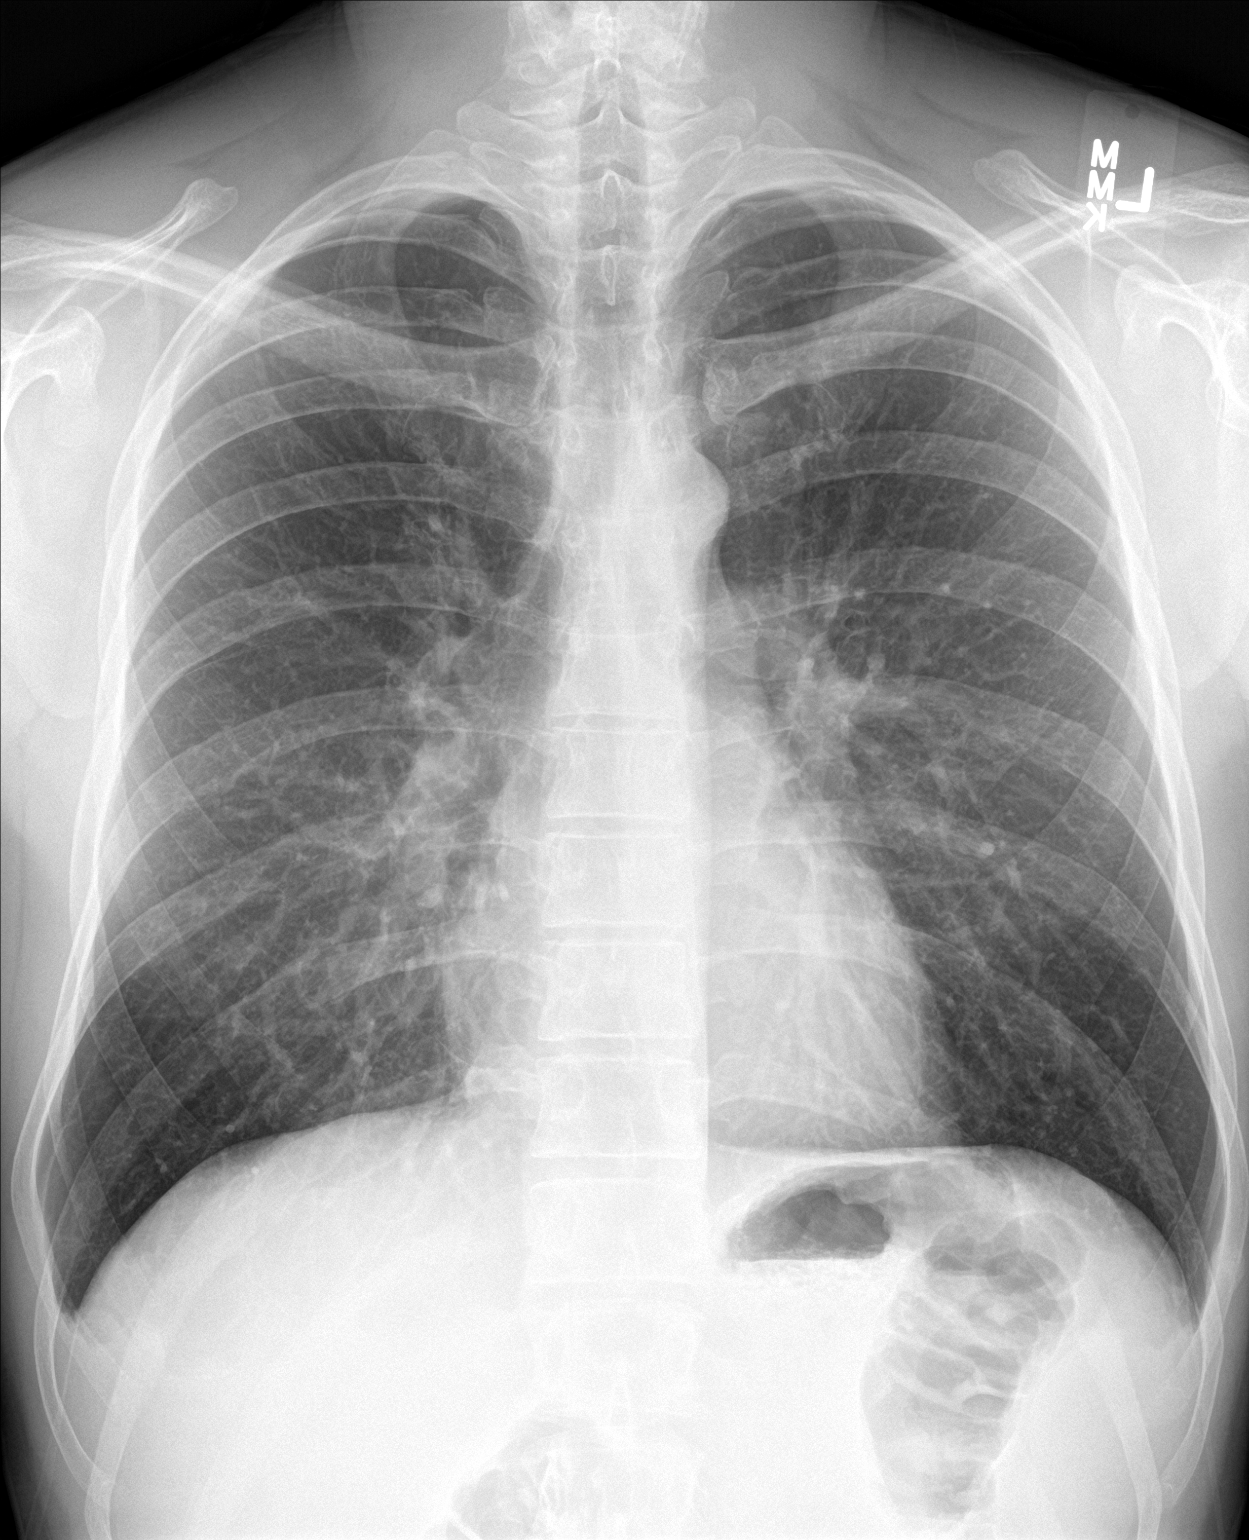

[chest lat]
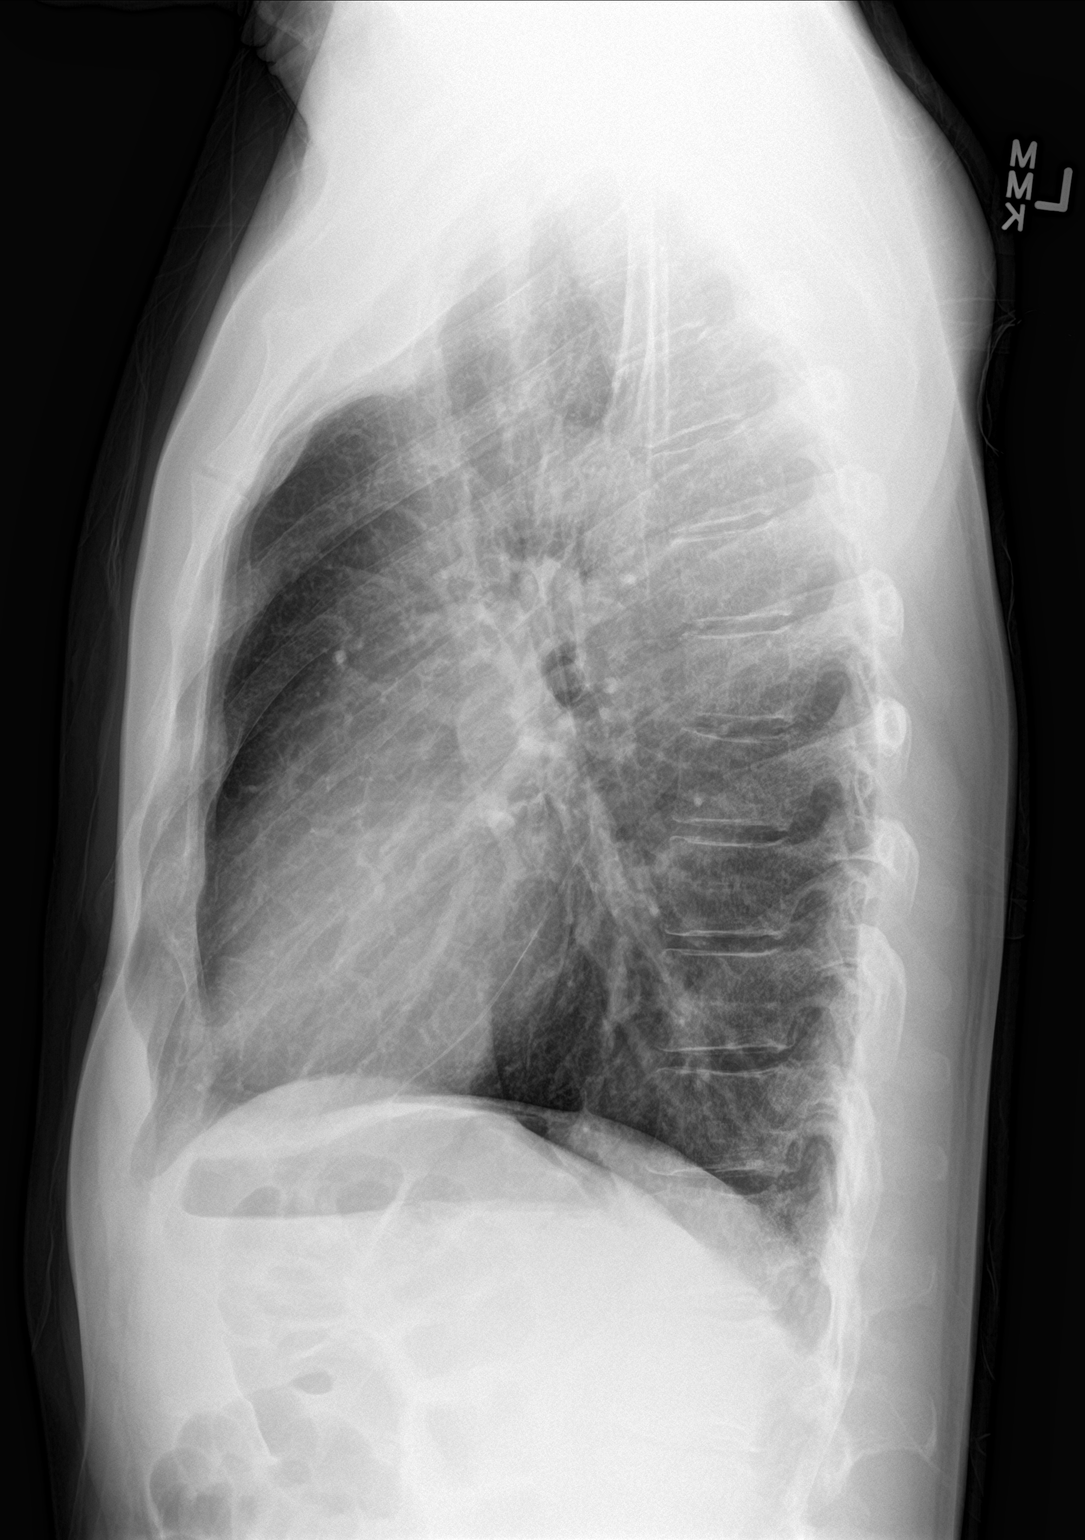

[2 of 2 positions shown; findings below may reference images not displayed]

FINDINGS: Mediastinum hilar structures are normal. Mild bilateral perihilar
pulmonary interstitial prominence noted. Mild pneumonitis cannot be
excluded. Heart size stable. No prominent pleural effusion stable
mild pleural thickening most likely scarring. No pneumothorax. No
acute bony abnormality .
IMPRESSION: Mild bilateral pulmonary interstitial prominence. Mild pneumonitis
cannot be excluded.

## 2017-04-05 DIAGNOSIS — K5090 Crohn's disease, unspecified, without complications: Secondary | ICD-10-CM | POA: Diagnosis not present

## 2017-04-05 DIAGNOSIS — Z79899 Other long term (current) drug therapy: Secondary | ICD-10-CM | POA: Diagnosis not present

## 2017-05-22 ENCOUNTER — Ambulatory Visit (INDEPENDENT_AMBULATORY_CARE_PROVIDER_SITE_OTHER): Payer: BLUE CROSS/BLUE SHIELD | Admitting: Family

## 2017-05-22 ENCOUNTER — Encounter: Payer: Self-pay | Admitting: Family

## 2017-05-22 ENCOUNTER — Other Ambulatory Visit (INDEPENDENT_AMBULATORY_CARE_PROVIDER_SITE_OTHER): Payer: BLUE CROSS/BLUE SHIELD

## 2017-05-22 ENCOUNTER — Other Ambulatory Visit: Payer: Self-pay

## 2017-05-22 VITALS — BP 116/78 | HR 92 | Temp 98.3°F | Ht 70.0 in | Wt 155.1 lb

## 2017-05-22 DIAGNOSIS — R300 Dysuria: Secondary | ICD-10-CM

## 2017-05-22 DIAGNOSIS — R361 Hematospermia: Secondary | ICD-10-CM

## 2017-05-22 LAB — COMPREHENSIVE METABOLIC PANEL
ALT: 15 U/L (ref 0–53)
AST: 18 U/L (ref 0–37)
Albumin: 4.3 g/dL (ref 3.5–5.2)
Alkaline Phosphatase: 48 U/L (ref 39–117)
BUN: 12 mg/dL (ref 6–23)
CO2: 33 mEq/L — ABNORMAL HIGH (ref 19–32)
Calcium: 9.8 mg/dL (ref 8.4–10.5)
Chloride: 103 mEq/L (ref 96–112)
Creatinine, Ser: 1.14 mg/dL (ref 0.40–1.50)
GFR: 76.2 mL/min (ref >60.00)
Glucose, Bld: 87 mg/dL (ref 70–99)
Potassium: 4.3 mEq/L (ref 3.5–5.1)
Sodium: 141 mEq/L (ref 135–145)
Total Bilirubin: 0.8 mg/dL (ref 0.2–1.2)
Total Protein: 7.1 g/dL (ref 6.0–8.3)

## 2017-05-22 LAB — CBC WITH DIFFERENTIAL/PLATELET
Basophils Absolute: 0.1 10*3/uL (ref 0.0–0.1)
Basophils Relative: 0.7 % (ref 0.0–3.0)
Eosinophils Absolute: 0.2 10*3/uL (ref 0.0–0.7)
Eosinophils Relative: 1.9 % (ref 0.0–5.0)
HCT: 47.2 % (ref 39.0–52.0)
Hemoglobin: 16.1 g/dL (ref 13.0–17.0)
Lymphocytes Relative: 37.9 % (ref 12.0–46.0)
Lymphs Abs: 3.7 10*3/uL (ref 0.7–4.0)
MCHC: 34.2 g/dL (ref 30.0–36.0)
MCV: 89.2 fl (ref 78.0–100.0)
Monocytes Absolute: 0.7 10*3/uL (ref 0.1–1.0)
Monocytes Relative: 7.1 % (ref 3.0–12.0)
Neutro Abs: 5.2 10*3/uL (ref 1.4–7.7)
Neutrophils Relative %: 52.4 % (ref 43.0–77.0)
Platelets: 183 10*3/uL (ref 150.0–400.0)
RBC: 5.29 Mil/uL (ref 4.22–5.81)
RDW: 12.7 % (ref 11.5–15.5)
WBC: 9.8 10*3/uL (ref 4.0–10.5)

## 2017-05-22 LAB — URINALYSIS, ROUTINE W REFLEX MICROSCOPIC
Bilirubin Urine: NEGATIVE
Ketones, ur: NEGATIVE
Leukocytes, UA: NEGATIVE
Nitrite: NEGATIVE
Specific Gravity, Urine: 1.025 (ref 1.000–1.030)
Total Protein, Urine: NEGATIVE
Urine Glucose: NEGATIVE
Urobilinogen, UA: 0.2 (ref 0.0–1.0)
pH: 6 (ref 5.0–8.0)

## 2017-05-22 LAB — PSA: PSA: 0.98 ng/mL (ref 0.10–4.00)

## 2017-05-22 NOTE — Progress Notes (Signed)
Jeffrey Reilly is a 37 y.o. male with the following history as recorded in Epic[facility]:  Patient Active Problem List   Diagnosis Date Noted  . Influenza with respiratory manifestation 06/18/2015  . Infliximab (Remicade) long-term use 01/08/2013  . Crohn's disease of both small and large intestine with intestinal obstruction ([place]) 06/08/2012    Current Outpatient Medications  Medication Sig Dispense Refill  . inFLIXimab (REMICADE) 100 MG injection Inject 300 mg at 0 wk, 2 wk, 6 wk, and 8 wk intervals  To be infused at [facility] for benadryl 50 mg and tylenol 500 mg prior to injection 1 each 1  . Probiotic Product (PROBIOTIC DAILY PO) Take by mouth. 2 caps daily     No current facility-administered medications for this visit.     Allergies: Azathioprine; Acetaminophen; and Chlorhexidine  Past Medical History:  Diagnosis Date  . Crohn's disease of both small and large intestine with intestinal obstruction ([place]) 03/2012   treated in [place]    Past Surgical History:  Procedure Laterality Date  . SMALL INTESTINE SURGERY  03/2012   190 cm of small intestine removed    Family History  Problem Relation Age of Onset  . Diabetes Father   . Cancer Other        Breast and Prostate Cancer  . Alcohol abuse Neg Hx   . Heart disease Neg Hx   . Hyperlipidemia Neg Hx   . Hypertension Neg Hx   . Kidney disease Neg Hx   . Stroke Neg Hx   . Early death Neg Hx     Social History   Tobacco Use  . Smoking status: Never Smoker  . Smokeless tobacco: Never Used  Substance Use Topics  . Alcohol use: No    Subjective:  Patient notes that approximately 2 weeks ago first noticed discoloration in this urine; notes that symptoms resolved with no further symptoms and he didn't think much else of it; he and his wife were on vacation this past week and that over the weekend, he noticed some low back pain and an episode of bleeding after intercourse; he did not feel like the blood was  actually in the semen however- looked more like blood clots that came after ejaculation; notes that today he is feeling "back to normal" and symptoms have resolved; wife was very concerned about him and asked him to come for a follow-up; no prior history of prostatitis or kidney stones; no fever; has not seen any further discoloration in his urine;   Objective:  Vitals:   05/22/17 1615  BP: 116/78  Pulse: 92  Temp: 98.3 F (36.8 C)  TempSrc: Oral  SpO2: 98%  Weight: 155 lb 1.9 oz (70.4 kg)  Height: 5' 10"  (1.778 m)    General: Well developed, well nourished, in no acute distress  Skin : Warm and dry.  Head: Normocephalic and atraumatic  Lungs: Respirations unlabored; clear to auscultation bilaterally without wheeze, rales, rhonchi  CVS exam: normal rate and regular rhythm.  Abdomen: Soft; nontender; nondistended; normoactive bowel sounds; no masses or hepatosplenomegaly  Musculoskeletal: No deformities; no active joint inflammation  Neurologic: Alert and oriented; speech intact; face symmetrical; moves all extremities well; CNII-XII intact without focal deficit  Assessment:  1. Dysuria   2. Hematospermia     Plan:  ? Atypical presentation for prostatitis; check CBC, CMP, U/A and urine culture today; agree to hold antibiotics unless absolutely necessary due to potential complications with his Crohn's disease; follow-up to  be determined based on labs.    No Follow-up on file.  Orders Placed This Encounter  Procedures  . Urine Culture    Standing Status:   Future    Number of Occurrences:   1    Standing Expiration Date:   05/22/2018  . CBC with Differential/Platelet    Standing Status:   Future    Number of Occurrences:   1    Standing Expiration Date:   05/23/2018  . Comp Met (CMET)    Standing Status:   Future    Number of Occurrences:   1    Standing Expiration Date:   05/22/2018  . PSA    Standing Status:   Future    Number of Occurrences:   1    Standing Expiration Date:    05/22/2018  . Urinalysis    Standing Status:   Future    Number of Occurrences:   1    Standing Expiration Date:   05/22/2018    Requested Prescriptions    No prescriptions requested or ordered in this encounter

## 2017-05-22 NOTE — Patient Instructions (Signed)
Prostatitis Prostatitis is swelling of the prostate gland. The prostate helps to make semen. It is below a man's bladder, in front of the rectum. There are different types of prostatitis. Follow these instructions at home:  Take over-the-counter and prescription medicines only as told by your doctor.  If you were prescribed an antibiotic medicine, take it as told by your doctor. Do not stop taking the antibiotic even if you start to feel better.  If your doctor prescribed exercises, do them as directed.  Take sitz baths as told by your doctor. To take a sitz bath, sit in warm water that is deep enough to cover your hips and butt.  Keep all follow-up visits as told by your doctor. This is important. Contact a doctor if:  Your symptoms get worse.  You have a fever. Get help right away if:  You have chills.  You feel sick to your stomach (nauseous).  You throw up (vomit).  You feel light-headed.  You feel like you might pass out (faint).  You cannot pee (urinate).  You have blood or clumps of blood (blood clots) in your pee (urine). This information is not intended to replace advice given to you by your health care provider. Make sure you discuss any questions you have with your health care provider. Document Released: 09/06/2011 Document Revised: 11/26/2015 Document Reviewed: 11/26/2015 Elsevier Interactive Patient Education  2017 Elsevier Inc[employer]  

## 2017-05-23 LAB — URINE CULTURE
MICRO NUMBER:: 90275221
Result:: NO GROWTH
SPECIMEN QUALITY:: ADEQUATE

## 2017-05-24 ENCOUNTER — Encounter: Payer: Self-pay | Admitting: Family

## 2017-05-24 ENCOUNTER — Emergency Department (HOSPITAL_COMMUNITY): Payer: BLUE CROSS/BLUE SHIELD

## 2017-05-24 ENCOUNTER — Other Ambulatory Visit: Payer: Self-pay | Admitting: Family

## 2017-05-24 ENCOUNTER — Emergency Department (HOSPITAL_COMMUNITY)
Admission: EM | Admit: 2017-05-24 | Discharge: 2017-05-24 | Disposition: A | Discharge status: Home / Self Care | Payer: BLUE CROSS/BLUE SHIELD | Attending: Emergency Medicine | Admitting: Emergency Medicine

## 2017-05-24 ENCOUNTER — Other Ambulatory Visit: Payer: Self-pay

## 2017-05-24 ENCOUNTER — Encounter (HOSPITAL_COMMUNITY): Payer: Self-pay

## 2017-05-24 DIAGNOSIS — N130 Hydronephrosis with ureteropelvic junction obstruction: Secondary | ICD-10-CM | POA: Insufficient documentation

## 2017-05-24 DIAGNOSIS — Z7982 Long term (current) use of aspirin: Secondary | ICD-10-CM | POA: Insufficient documentation

## 2017-05-24 DIAGNOSIS — R1111 Vomiting without nausea: Secondary | ICD-10-CM | POA: Diagnosis not present

## 2017-05-24 DIAGNOSIS — R1084 Generalized abdominal pain: Secondary | ICD-10-CM | POA: Diagnosis not present

## 2017-05-24 DIAGNOSIS — R1011 Right upper quadrant pain: Secondary | ICD-10-CM

## 2017-05-24 DIAGNOSIS — R319 Hematuria, unspecified: Secondary | ICD-10-CM

## 2017-05-24 DIAGNOSIS — Z79899 Other long term (current) drug therapy: Secondary | ICD-10-CM | POA: Insufficient documentation

## 2017-05-24 DIAGNOSIS — N201 Calculus of ureter: Secondary | ICD-10-CM | POA: Diagnosis not present

## 2017-05-24 LAB — BASIC METABOLIC PANEL
Anion gap: 14 (ref 5–15)
BUN: 12 mg/dL (ref 6–20)
CO2: 22 mmol/L (ref 22–32)
Calcium: 9.2 mg/dL (ref 8.9–10.3)
Chloride: 106 mmol/L (ref 101–111)
Creatinine, Ser: 1.15 mg/dL (ref 0.61–1.24)
GFR calc Af Amer: >60 mL/min (ref >60)
GFR calc non Af Amer: >60 mL/min (ref >60)
Glucose, Bld: 129 mg/dL — ABNORMAL HIGH (ref 65–99)
Potassium: 3.7 mmol/L (ref 3.5–5.1)
Sodium: 142 mmol/L (ref 135–145)

## 2017-05-24 LAB — CBC WITH DIFFERENTIAL/PLATELET
Basophils Absolute: 0 10*3/uL (ref 0.0–0.1)
Basophils Relative: 0 %
Eosinophils Absolute: 0.1 10*3/uL (ref 0.0–0.7)
Eosinophils Relative: 1 %
HCT: 47.8 % (ref 39.0–52.0)
Hemoglobin: 16.3 g/dL (ref 13.0–17.0)
Lymphocytes Relative: 25 %
Lymphs Abs: 2.6 10*3/uL (ref 0.7–4.0)
MCH: 30.8 pg (ref 26.0–34.0)
MCHC: 34.1 g/dL (ref 30.0–36.0)
MCV: 90.2 fL (ref 78.0–100.0)
Monocytes Absolute: 0.5 10*3/uL (ref 0.1–1.0)
Monocytes Relative: 4 %
Neutro Abs: 7.5 10*3/uL (ref 1.7–7.7)
Neutrophils Relative %: 70 %
Platelets: 164 10*3/uL (ref 150–400)
RBC: 5.3 MIL/uL (ref 4.22–5.81)
RDW: 12.7 % (ref 11.5–15.5)
WBC: 10.6 10*3/uL — ABNORMAL HIGH (ref 4.0–10.5)

## 2017-05-24 LAB — URINALYSIS, ROUTINE W REFLEX MICROSCOPIC
Bilirubin Urine: NEGATIVE
Glucose, UA: NEGATIVE mg/dL
Ketones, ur: 20 mg/dL — AB
Nitrite: NEGATIVE
Protein, ur: 100 mg/dL — AB
Specific Gravity, Urine: 1.026 (ref 1.005–1.030)
Squamous Epithelial / LPF: NONE SEEN
pH: 6 (ref 5.0–8.0)

## 2017-05-24 MED ORDER — KETOROLAC TROMETHAMINE 30 MG/ML IJ SOLN
30.0000 mg | Freq: Once | INTRAMUSCULAR | Status: AC
  Administered 2017-05-24: 30 mg via INTRAMUSCULAR
  Filled 2017-05-24: qty 1

## 2017-05-24 MED ORDER — FENTANYL CITRATE (PF) 100 MCG/2ML IJ SOLN: 50.0000 ug | Freq: Once | INTRAMUSCULAR | Status: DC

## 2017-05-24 MED ORDER — HYDROMORPHONE HCL 1 MG/ML IJ SOLN
0.5000 mg | Freq: Once | INTRAMUSCULAR | Status: AC
  Administered 2017-05-24: 0.5 mg via INTRAVENOUS
  Filled 2017-05-24: qty 1

## 2017-05-24 MED ORDER — OXYCODONE HCL 5 MG PO CAPS: 5.0000 mg | ORAL_CAPSULE | ORAL | 0 refills | Status: DC | PRN

## 2017-05-24 MED ORDER — ONDANSETRON HCL 4 MG PO TABS: 4.0000 mg | ORAL_TABLET | Freq: Four times a day (QID) | ORAL | 0 refills | Status: DC

## 2017-05-24 MED ORDER — ONDANSETRON HCL 4 MG/2ML IJ SOLN
4.0000 mg | Freq: Once | INTRAMUSCULAR | Status: AC
  Administered 2017-05-24: 4 mg via INTRAVENOUS
  Filled 2017-05-24: qty 2

## 2017-05-24 MED ORDER — TAMSULOSIN HCL 0.4 MG PO CAPS: 0.4000 mg | ORAL_CAPSULE | Freq: Every day | ORAL | 0 refills | Status: DC

## 2017-05-24 MED ORDER — FENTANYL CITRATE (PF) 100 MCG/2ML IJ SOLN
50.0000 ug | Freq: Once | INTRAMUSCULAR | Status: AC
  Administered 2017-05-24: 50 ug via INTRAVENOUS
  Filled 2017-05-24: qty 2

## 2017-05-24 MED ORDER — OXYCODONE HCL 5 MG PO TABS
5.0000 mg | ORAL_TABLET | Freq: Once | ORAL | Status: AC
  Administered 2017-05-24: 5 mg via ORAL
  Filled 2017-05-24: qty 1

## 2017-05-24 NOTE — ED Provider Notes (Signed)
[facility] EMERGENCY DEPARTMENT Provider Note   CSN: [id number] Arrival date & time: 05/24/17  1145     History   Chief Complaint Chief Complaint  Patient presents with  . Nephrolithiasis    HPI Jeffrey Reilly is a 37 y.o. male.  HPI   37 year old male presents today with complaints of right flank pain.  Patient notes that 5 days ago he had pain in his right flank, sharp in nature that went away with aspirin.  Patient notes that he has been a symptom medic since that time.  He notes he had acute onset of right severe sharp flank pain this morning that has persisted despite aspirin.  Patient notes he did follow-up with his primary care prior to onset of symptoms who ordered an outpatient CT renal study and urinalysis showing calcium in his urine.  Patient denies any fevers, abdominal pain, reports he has severe nausea and vomiting after onset of pain, no changes in urination.  No history of the same.  Mother reports a history of kidney stones.  Patient does note a history of Crohn's disease, no active bleeding or history of bleeds.     Past Medical History:  Diagnosis Date  . Crohn's disease of both small and large intestine with intestinal obstruction ([place]) 03/2012   treated in [place]    Patient Active Problem List   Diagnosis Date Noted  . Influenza with respiratory manifestation 06/18/2015  . Infliximab (Remicade) long-term use 01/08/2013  . Crohn's disease of both small and large intestine with intestinal obstruction ([place]) 06/08/2012    Past Surgical History:  Procedure Laterality Date  . SMALL INTESTINE SURGERY  03/2012   190 cm of small intestine removed       Home Medications    Prior to Admission medications   Medication Sig Start Date End Date Taking? Authorizing Provider  aspirin 325 MG tablet Take 325 mg by mouth once.   Yes [provider]  cholecalciferol (VITAMIN D) 1000 units tablet Take 1,000 Units by mouth daily.   Yes  [provider]  Cyanocobalamin (B-12) 3000 MCG SUBL Place 3,000 mcg under the tongue 3 (three) times a week.   Yes [provider]  inFLIXimab (REMICADE) 100 MG injection Inject 300 mg at 0 wk, 2 wk, 6 wk, and 8 wk intervals  To be infused at [facility] for benadryl 50 mg and tylenol 500 mg prior to injection Patient taking differently: Inject 300 mg as directed See admin instructions. Inject 300 mg at 0 wk, 2 wk, 6 wk, and 8 wk intervals  OK for benadryl 50 mg and tylenol 500 mg prior to injection 01/15/13  Yes Janith Lima, MD  Probiotic Product (PROBIOTIC DAILY PO) Take 1 capsule by mouth every morning.    Yes [provider]  ondansetron (ZOFRAN) 4 MG tablet Take 1 tablet (4 mg total) by mouth every 6 (six) hours. 05/24/17   Clement Deneault, Dellis Filbert, PA-C  oxycodone (OXY-IR) 5 MG capsule Take 1 capsule (5 mg total) by mouth every 4 (four) hours as needed. 05/24/17   Pansy Ostrovsky, Dellis Filbert, PA-C  tamsulosin (FLOMAX) 0.4 MG CAPS capsule Take 1 capsule (0.4 mg total) by mouth daily. 05/24/17   Okey Regal, PA-C    Family History Family History  Problem Relation Age of Onset  . Diabetes Father   . Cancer Other        Breast and Prostate Cancer  . Alcohol abuse Neg Hx   .  Heart disease Neg Hx   . Hyperlipidemia Neg Hx   . Hypertension Neg Hx   . Kidney disease Neg Hx   . Stroke Neg Hx   . Early death Neg Hx     Social History Social History   Tobacco Use  . Smoking status: Never Smoker  . Smokeless tobacco: Never Used  Substance Use Topics  . Alcohol use: No  . Drug use: No     Allergies   Azathioprine; Acetaminophen; and Chlorhexidine   Review of Systems Review of Systems  All other systems reviewed and are negative.   Physical Exam Updated Vital Signs BP 114/66   Pulse (!) 58   Temp (!) 97.5 F (36.4 C) (Oral)   Resp 18   Ht 5' 10"  (1.778 m)   Wt 68 kg (150 lb)   SpO2 100%   BMI 21.52 kg/m   Physical Exam  Constitutional:  He is oriented to person, place, and time. He appears well-developed and well-nourished.  Pale, nauseous, uncomfortable appearing  HENT:  Head: Normocephalic and atraumatic.  Eyes: Conjunctivae are normal. Pupils are equal, round, and reactive to light. Right eye exhibits no discharge. Left eye exhibits no discharge. No scleral icterus.  Neck: Normal range of motion. No JVD present. No tracheal deviation present.  Pulmonary/Chest: Effort normal. No stridor.  Abdominal: Soft. Bowel sounds are normal. He exhibits no distension and no mass. There is no tenderness. There is no rebound and no guarding. No hernia.  No CVA tenderness  Neurological: He is alert and oriented to person, place, and time. Coordination normal.  Psychiatric: He has a normal mood and affect. His behavior is normal. Judgment and thought content normal.  Nursing note and vitals reviewed.   ED Treatments / Results  Labs (all labs ordered are listed, but only abnormal results are displayed) Labs Reviewed  CBC WITH DIFFERENTIAL/PLATELET - Abnormal; Notable for the following components:      Result Value   WBC 10.6 (*)    All other components within normal limits  BASIC METABOLIC PANEL - Abnormal; Notable for the following components:   Glucose, Bld 129 (*)    All other components within normal limits  URINALYSIS, ROUTINE W REFLEX MICROSCOPIC - Abnormal; Notable for the following components:   Color, Urine AMBER (*)    APPearance HAZY (*)    Hgb urine dipstick LARGE (*)    Ketones, ur 20 (*)    Protein, ur 100 (*)    Leukocytes, UA TRACE (*)    Bacteria, UA RARE (*)    All other components within normal limits  URINE CULTURE    EKG  EKG Interpretation None       Radiology Ct Renal Stone Study  Result Date: 05/24/2017 CLINICAL DATA:  Low back pain over the last several days, worsening today with vomiting. Stone disease suspected. EXAM: CT ABDOMEN AND PELVIS WITHOUT CONTRAST TECHNIQUE: Multidetector CT imaging  of the abdomen and pelvis was performed following the standard protocol without IV contrast. COMPARISON:  None. FINDINGS: Lower chest: Mild linear scarring or atelectasis at the lung bases. No pleural or pericardial fluid. Hepatobiliary: Normal Pancreas: Normal Spleen: Normal Adrenals/Urinary Tract: Adrenal glands are normal. Left kidney is normal. Right kidney is normal, except for fullness of the renal collecting system an ureter. The ureter is dilated all the way to the UVJ where there is a 3 x 6 mm stone. No stone in the bladder. Stomach/Bowel: Normal except for surgical clips in the right lower  quadrant. Vascular/Lymphatic: Normal Reproductive: Normal Other: No free fluid or air. Musculoskeletal: Normal IMPRESSION: 3 x 6 mm stone at the right UVJ.  Mild right hydroureteronephrosis. Electronically Signed   By: Nelson Chimes M.D.   On: 05/24/2017 13:34    Procedures Procedures (including critical care time)  Medications Ordered in ED Medications  ketorolac (TORADOL) 30 MG/ML injection 30 mg (30 mg Intramuscular Given 05/24/17 1224)  ondansetron (ZOFRAN) injection 4 mg (4 mg Intravenous Given 05/24/17 1224)  oxyCODONE (Oxy IR/ROXICODONE) immediate release tablet 5 mg (5 mg Oral Given 05/24/17 1406)  fentaNYL (SUBLIMAZE) injection 50 mcg (50 mcg Intravenous Given 05/24/17 1425)  HYDROmorphone (DILAUDID) injection 0.5 mg (0.5 mg Intravenous Given 05/24/17 1614)     Initial Impression / Assessment and Plan / ED Course  I have reviewed the triage vital signs and the nursing notes.  Pertinent labs & imaging results that were available during my care of the patient were reviewed by me and considered in my medical decision making (see chart for details).      Final Clinical Impressions(s) / ED Diagnoses   Final diagnoses:  Ureterolithiasis    Labs: cbc, bmp, urinalysis   Imaging: Ct renal  Consults:  Therapeutics: zofran, toradol, fentanyl, Dilaudid  Discharge Meds: OxyIR,  Flomax  Assessment/Plan: 37 year old male presents today with acute onset of right flank pain.  Patient's presentation is most consistent with nephrolithiasis.  He has no abdominal pain reproducible pain, CVA tenderness, fever.  Patient given Toradol and Zofran which significantly improved his symptoms.  Patient does have a history of Crohn's, no history of bleeds or  contraindications to Toradol.  Patient's pain improved with Toradol but returned again, he was treated several times with the above medications.  Patient's CT shows a 3 x 6 stone at right UVJ with mild right hydroureteronephrosis.  No signs of infection.  Patient's pain is manageable, he will be discharged with Flomax, pain medication and close urology follow-up.  He is given strict return precautions.  Both patient and his mother verbalized understanding and agreement to today's plan had no further questions or concerns    ED Discharge Orders        Ordered    oxycodone (OXY-IR) 5 MG capsule  Every 4 hours PRN     05/24/17 1636    tamsulosin (FLOMAX) 0.4 MG CAPS capsule  Daily     05/24/17 1637    ondansetron (ZOFRAN) 4 MG tablet  Every 6 hours     05/24/17 1640       Okey Regal, PA-C 05/24/17 1640    Dorie Rank, MD 05/25/17 [phone]

## 2017-05-24 NOTE — Discharge Instructions (Signed)
Please read attached information. If you experience any new or worsening signs or symptoms please return to the emergency room for evaluation. Please follow-up with your primary care provider or specialist as discussed. Please use medication prescribed only as directed and discontinue taking if you have any concerning signs or symptoms.   °

## 2017-05-24 NOTE — ED Triage Notes (Signed)
PT arrives to ed with right flank pain for several days that became worse at 1000. PT states he had urine and blood work done at [facility] 2 days ago and had calcium and hgb in ua so is supposed to go for Renal stone CT. Endorses emesis today

## 2017-05-24 NOTE — Progress Notes (Signed)
Reviewed with patient; based on reading in UP To Date that I have done, it is not inappropriate to hold antibiotics at this time since patient is asymptomatic and no risk for STDs; we both agree this is most appropriate treatment due to his GI issues and potential complications with antibiotics; he understands if he sees blood again with ejaculation will need to go to urology; discussed concerns for changes noted on U/A however with blood and calcium crystals; will set up CT for renal stone and follow-up to be determined.

## 2017-05-24 NOTE — ED Notes (Signed)
Patient transported to CT 

## 2017-05-24 NOTE — Telephone Encounter (Signed)
Pt wife called and stated the patient just vomitted from the pain. She was calling to check on the statu pf the CT scan, however based on this Mickel Baas was near by and advised the patient go to the ER, the wife was told this info and sounded confused and asked where to take him and what building she was informed [facility] or [facility], really any hospital,  and that this was kinda serious.  She states understanding but sounded discombobulated.  I will check later to see id patient goes to ED.

## 2017-05-25 LAB — URINE CULTURE
Culture: NO GROWTH
Special Requests: NORMAL

## 2017-05-29 DIAGNOSIS — N201 Calculus of ureter: Secondary | ICD-10-CM | POA: Diagnosis not present

## 2017-05-31 DIAGNOSIS — K5090 Crohn's disease, unspecified, without complications: Secondary | ICD-10-CM | POA: Diagnosis not present

## 2017-06-12 DIAGNOSIS — N201 Calculus of ureter: Secondary | ICD-10-CM | POA: Diagnosis not present

## 2017-07-26 DIAGNOSIS — D638 Anemia in other chronic diseases classified elsewhere: Secondary | ICD-10-CM | POA: Diagnosis not present

## 2017-07-26 DIAGNOSIS — Z79899 Other long term (current) drug therapy: Secondary | ICD-10-CM | POA: Diagnosis not present

## 2017-07-26 DIAGNOSIS — K5090 Crohn's disease, unspecified, without complications: Secondary | ICD-10-CM | POA: Diagnosis not present

## 2017-08-17 ENCOUNTER — Encounter (HOSPITAL_COMMUNITY): Payer: Self-pay | Admitting: *Deleted

## 2017-08-17 ENCOUNTER — Other Ambulatory Visit: Payer: Self-pay | Admitting: Urology

## 2017-08-17 ENCOUNTER — Other Ambulatory Visit: Payer: Self-pay

## 2017-08-17 DIAGNOSIS — R8271 Bacteriuria: Secondary | ICD-10-CM | POA: Diagnosis not present

## 2017-08-17 DIAGNOSIS — R1111 Vomiting without nausea: Secondary | ICD-10-CM | POA: Diagnosis not present

## 2017-08-17 DIAGNOSIS — R109 Unspecified abdominal pain: Secondary | ICD-10-CM | POA: Diagnosis not present

## 2017-08-17 DIAGNOSIS — N201 Calculus of ureter: Secondary | ICD-10-CM | POA: Diagnosis not present

## 2017-08-18 ENCOUNTER — Ambulatory Visit (HOSPITAL_COMMUNITY): Payer: BLUE CROSS/BLUE SHIELD

## 2017-08-18 ENCOUNTER — Ambulatory Visit (HOSPITAL_COMMUNITY): Payer: BLUE CROSS/BLUE SHIELD | Admitting: Certified Registered Nurse Anesthetist

## 2017-08-18 ENCOUNTER — Encounter (HOSPITAL_COMMUNITY): Payer: Self-pay | Admitting: *Deleted

## 2017-08-18 ENCOUNTER — Ambulatory Visit (HOSPITAL_COMMUNITY)
Admission: RE | Admit: 2017-08-18 | Discharge: 2017-08-18 | Disposition: A | Discharge status: Home / Self Care | Payer: BLUE CROSS/BLUE SHIELD | Source: Ambulatory Visit | Attending: Urology | Admitting: Urology

## 2017-08-18 ENCOUNTER — Encounter (HOSPITAL_COMMUNITY)
Admission: RE | Disposition: A | Discharge status: Home / Self Care | Payer: Self-pay | Source: Ambulatory Visit | Attending: Urology

## 2017-08-18 DIAGNOSIS — Z888 Allergy status to other drugs, medicaments and biological substances status: Secondary | ICD-10-CM | POA: Diagnosis not present

## 2017-08-18 DIAGNOSIS — K50012 Crohn's disease of small intestine with intestinal obstruction: Secondary | ICD-10-CM | POA: Diagnosis not present

## 2017-08-18 DIAGNOSIS — N201 Calculus of ureter: Secondary | ICD-10-CM | POA: Diagnosis not present

## 2017-08-18 DIAGNOSIS — K219 Gastro-esophageal reflux disease without esophagitis: Secondary | ICD-10-CM | POA: Diagnosis not present

## 2017-08-18 DIAGNOSIS — K5080 Crohn's disease of both small and large intestine without complications: Secondary | ICD-10-CM | POA: Insufficient documentation

## 2017-08-18 DIAGNOSIS — Z79899 Other long term (current) drug therapy: Secondary | ICD-10-CM | POA: Insufficient documentation

## 2017-08-18 HISTORY — PX: CYSTOSCOPY WITH RETROGRADE PYELOGRAM, URETEROSCOPY AND STENT PLACEMENT: SHX5789

## 2017-08-18 HISTORY — PX: HOLMIUM LASER APPLICATION: SHX5852

## 2017-08-18 HISTORY — DX: Gastro-esophageal reflux disease without esophagitis: K21.9

## 2017-08-18 HISTORY — DX: Personal history of urinary calculi: Z87.442

## 2017-08-18 LAB — CBC
HCT: 48 % (ref 39.0–52.0)
Hemoglobin: 16.5 g/dL (ref 13.0–17.0)
MCH: 31.2 pg (ref 26.0–34.0)
MCHC: 34.4 g/dL (ref 30.0–36.0)
MCV: 90.7 fL (ref 78.0–100.0)
Platelets: 136 10*3/uL — ABNORMAL LOW (ref 150–400)
RBC: 5.29 MIL/uL (ref 4.22–5.81)
RDW: 12.4 % (ref 11.5–15.5)
WBC: 10.3 10*3/uL (ref 4.0–10.5)

## 2017-08-18 LAB — BASIC METABOLIC PANEL
Anion gap: 7 (ref 5–15)
BUN: 22 mg/dL — ABNORMAL HIGH (ref 6–20)
CO2: 30 mmol/L (ref 22–32)
Calcium: 9.2 mg/dL (ref 8.9–10.3)
Chloride: 104 mmol/L (ref 101–111)
Creatinine, Ser: 2.17 mg/dL — ABNORMAL HIGH (ref 0.61–1.24)
GFR calc Af Amer: 43 mL/min — ABNORMAL LOW (ref >60)
GFR calc non Af Amer: 37 mL/min — ABNORMAL LOW (ref >60)
Glucose, Bld: 95 mg/dL (ref 65–99)
Potassium: 3.9 mmol/L (ref 3.5–5.1)
Sodium: 141 mmol/L (ref 135–145)

## 2017-08-18 SURGERY — CYSTOURETEROSCOPY, WITH RETROGRADE PYELOGRAM AND STENT INSERTION
Anesthesia: General | Laterality: Right

## 2017-08-18 MED ORDER — GENTAMICIN SULFATE 40 MG/ML IJ SOLN
5.0000 mg/kg | INTRAVENOUS | Status: AC
  Administered 2017-08-18: 340 mg via INTRAVENOUS
  Filled 2017-08-18: qty 8.5

## 2017-08-18 MED ORDER — MIDAZOLAM HCL 5 MG/5ML IJ SOLN
INTRAMUSCULAR | Status: DC | PRN
  Administered 2017-08-18 (×2): 1 mg via INTRAVENOUS

## 2017-08-18 MED ORDER — OXYCODONE HCL 5 MG PO CAPS: 5.0000 mg | ORAL_CAPSULE | ORAL | 0 refills | Status: DC | PRN

## 2017-08-18 MED ORDER — ONDANSETRON HCL 4 MG/2ML IJ SOLN
INTRAMUSCULAR | Status: DC | PRN
  Administered 2017-08-18: 4 mg via INTRAVENOUS

## 2017-08-18 MED ORDER — KETOROLAC TROMETHAMINE 10 MG PO TABS: 10.0000 mg | ORAL_TABLET | Freq: Four times a day (QID) | ORAL | 0 refills | Status: DC | PRN

## 2017-08-18 MED ORDER — PROPOFOL 10 MG/ML IV BOLUS
INTRAVENOUS | Status: DC | PRN
  Administered 2017-08-18: 170 mg via INTRAVENOUS

## 2017-08-18 MED ORDER — SODIUM CHLORIDE 0.9 % IV SOLN
INTRAVENOUS | Status: DC | PRN
  Administered 2017-08-18: 15 mL

## 2017-08-18 MED ORDER — 0.9 % SODIUM CHLORIDE (POUR BTL) OPTIME
TOPICAL | Status: DC | PRN
  Administered 2017-08-18: 1000 mL

## 2017-08-18 MED ORDER — CEPHALEXIN 500 MG PO CAPS: 500.0000 mg | ORAL_CAPSULE | Freq: Two times a day (BID) | ORAL | 0 refills | Status: DC

## 2017-08-18 MED ORDER — PROPOFOL 10 MG/ML IV BOLUS
INTRAVENOUS | Status: AC
  Filled 2017-08-18: qty 20

## 2017-08-18 MED ORDER — FENTANYL CITRATE (PF) 250 MCG/5ML IJ SOLN
INTRAMUSCULAR | Status: AC
  Filled 2017-08-18: qty 5

## 2017-08-18 MED ORDER — MIDAZOLAM HCL 2 MG/2ML IJ SOLN
INTRAMUSCULAR | Status: AC
  Filled 2017-08-18: qty 2

## 2017-08-18 MED ORDER — LACTATED RINGERS IV SOLN
INTRAVENOUS | Status: DC
  Administered 2017-08-18: 14:00:00 via INTRAVENOUS

## 2017-08-18 MED ORDER — DEXAMETHASONE SODIUM PHOSPHATE 10 MG/ML IJ SOLN
INTRAMUSCULAR | Status: DC | PRN
  Administered 2017-08-18: 10 mg via INTRAVENOUS

## 2017-08-18 MED ORDER — FENTANYL CITRATE (PF) 100 MCG/2ML IJ SOLN
INTRAMUSCULAR | Status: DC | PRN
  Administered 2017-08-18 (×2): 25 ug via INTRAVENOUS

## 2017-08-18 MED ORDER — LIDOCAINE 2% (20 MG/ML) 5 ML SYRINGE
INTRAMUSCULAR | Status: DC | PRN
  Administered 2017-08-18: 60 mg via INTRAVENOUS

## 2017-08-18 SURGICAL SUPPLY — 25 items
BAG URO CATCHER STRL LF (MISCELLANEOUS) ×2 IMPLANT
BASKET LASER NITINOL 1.9FR (BASKET) ×2 IMPLANT
CATH INTERMIT  6FR 70CM (CATHETERS) ×2 IMPLANT
CLOTH BEACON ORANGE TIMEOUT ST (SAFETY) ×2 IMPLANT
COVER FOOTSWITCH UNIV (MISCELLANEOUS) IMPLANT
COVER SURGICAL LIGHT HANDLE (MISCELLANEOUS) ×2 IMPLANT
EXTRACTOR STONE 1.7FRX115CM (UROLOGICAL SUPPLIES) IMPLANT
FIBER LASER FLEXIVA 1000 (UROLOGICAL SUPPLIES) IMPLANT
FIBER LASER FLEXIVA 365 (UROLOGICAL SUPPLIES) IMPLANT
FIBER LASER FLEXIVA 550 (UROLOGICAL SUPPLIES) IMPLANT
FIBER LASER TRAC TIP (UROLOGICAL SUPPLIES) ×2 IMPLANT
GLOVE BIOGEL M STRL SZ7.5 (GLOVE) ×2 IMPLANT
GOWN STRL REUS W/TWL LRG LVL3 (GOWN DISPOSABLE) ×4 IMPLANT
GUIDEWIRE ANG ZIPWIRE 038X150 (WIRE) ×2 IMPLANT
GUIDEWIRE STR DUAL SENSOR (WIRE) ×2 IMPLANT
IV NS 1000ML (IV SOLUTION) ×1
IV NS 1000ML BAXH (IV SOLUTION) ×1 IMPLANT
MANIFOLD NEPTUNE II (INSTRUMENTS) ×2 IMPLANT
PACK CYSTO (CUSTOM PROCEDURE TRAY) ×2 IMPLANT
SHEATH URETERAL 12FRX28CM (UROLOGICAL SUPPLIES) IMPLANT
SHEATH URETERAL 12FRX35CM (MISCELLANEOUS) IMPLANT
STENT POLARIS 5FRX26 (STENTS) ×2 IMPLANT
SYR CONTROL 10ML LL (SYRINGE) ×2 IMPLANT
TUBE FEEDING 8FR 16IN STR KANG (MISCELLANEOUS) ×2 IMPLANT
TUBING CONNECTING 10 (TUBING) ×2 IMPLANT

## 2017-08-18 NOTE — Anesthesia Postprocedure Evaluation (Signed)
Anesthesia Post Note  Patient: Jeffrey Reilly.  Procedure(s) Performed: CYSTOSCOPY WITH RETROGRADE PYELOGRAM, URETEROSCOPY AND STENT PLACEMENT (Right ) HOLMIUM LASER APPLICATION (Right )     Patient location during evaluation: PACU Anesthesia Type: General Level of consciousness: awake and alert Pain management: pain level controlled Vital Signs Assessment: post-procedure vital signs reviewed and stable Respiratory status: spontaneous breathing, nonlabored ventilation, respiratory function stable and patient connected to nasal cannula oxygen Cardiovascular status: blood pressure returned to baseline and stable Postop Assessment: no apparent nausea or vomiting Anesthetic complications: no    Last Vitals:  Vitals:   08/18/17 1700 08/18/17 1723  BP: 128/82 (!) 125/98  Pulse: 68 75  Resp: 15 16  Temp: 36.8 C 36.6 C  SpO2: 96% 98%    Last Pain:  Vitals:   08/18/17 1723  TempSrc: Oral  PainSc: 0-No pain                 Dyllen Menning S

## 2017-08-18 NOTE — Discharge Instructions (Signed)
1 - You may have urinary urgency (bladder spasms) and bloody urine on / off with stent in place. This is normal.  2 - Remove tethered stent on Monday morning by pulling on string, then blue-white plastic tubing, and discarding. Office is open Monday if any problems arise.   3 - Call MD or go to ER for fever >102, severe pain / nausea / vomiting not relieved by medications, or acute change in medical status    Cystoscopy, Care After Refer to this sheet in the next few weeks. These instructions provide you with information about caring for yourself after your procedure. Your health care provider may also give you more specific instructions. Your treatment has been planned according to current medical practices, but problems sometimes occur. Call your health care provider if you have any problems or questions after your procedure. What can I expect after the procedure? After the procedure, it is common to have:  Mild pain when you urinate. Pain should stop within a few minutes after you urinate. This may last for up to 1 week.  A small amount of blood in your urine for several days.  Feeling like you need to urinate but producing only a small amount of urine.  Follow these instructions at home:  Medicines  Take over-the-counter and prescription medicines only as told by your health care provider.  If you were prescribed an antibiotic medicine, take it as told by your health care provider. Do not stop taking the antibiotic even if you start to feel better. General instructions   Return to your normal activities as told by your health care provider. Ask your health care provider what activities are safe for you.  Do not drive for 24 hours if you received a sedative.  Watch for any blood in your urine. If the amount of blood in your urine increases, call your health care provider.  Follow instructions from your health care provider about eating or drinking restrictions.  If a tissue  sample was removed for testing (biopsy) during your procedure, it is your responsibility to get your test results. Ask your health care provider or the department performing the test when your results will be ready.  Drink enough fluid to keep your urine clear or pale yellow.  Keep all follow-up visits as told by your health care provider. This is important. Contact a health care provider if:  You have pain that gets worse or does not get better with medicine, especially pain when you urinate.  You have difficulty urinating. Get help right away if:  You have more blood in your urine.  You have blood clots in your urine.  You have abdominal pain.  You have a fever or chills.  You are unable to urinate. This information is not intended to replace advice given to you by your health care provider. Make sure you discuss any questions you have with your health care provider. Document Released: 09/24/2004 Document Revised: 08/13/2015 Document Reviewed: 01/22/2015 Elsevier Interactive Patient Education  [employer].

## 2017-08-18 NOTE — Transfer of Care (Signed)
Immediate Anesthesia Transfer of Care Note  Patient: Jeffrey Reilly.  Procedure(s) Performed: CYSTOSCOPY WITH RETROGRADE PYELOGRAM, URETEROSCOPY AND STENT PLACEMENT (Right ) HOLMIUM LASER APPLICATION (Right )  Patient Location: PACU  Anesthesia Type:General  Level of Consciousness: awake, drowsy and patient cooperative  Airway & Oxygen Therapy: Patient Spontanous Breathing and Patient connected to face mask oxygen  Post-op Assessment: Report given to RN and Post -op Vital signs reviewed and stable  Post vital signs: Reviewed and stable  Last Vitals:  Vitals Value Taken Time  BP    Temp    Pulse 65 08/18/2017  4:37 PM  Resp 8 08/18/2017  4:37 PM  SpO2 100 % 08/18/2017  4:37 PM  Vitals shown include unvalidated device data.  Last Pain:  Vitals:   08/18/17 1400  TempSrc:   PainSc: 3          Complications: No apparent anesthesia complications

## 2017-08-18 NOTE — Anesthesia Procedure Notes (Addendum)
Procedure Name: LMA Insertion Date/Time: 08/18/2017 3:39 PM Performed by: West Pugh, CRNA Pre-anesthesia Checklist: Patient identified, Emergency Drugs available, Suction available, Patient being monitored and Timeout performed Patient Re-evaluated:Patient Re-evaluated prior to induction Oxygen Delivery Method: Circle system utilized Preoxygenation: Pre-oxygenation with 100% oxygen Induction Type: IV induction Ventilation: Mask ventilation without difficulty LMA: LMA with gastric port inserted LMA Size: 4.0 Number of attempts: 1 Placement Confirmation: positive ETCO2 and breath sounds checked- equal and bilateral Tube secured with: Tape Dental Injury: Teeth and Oropharynx as per pre-operative assessment

## 2017-08-18 NOTE — H&P (Signed)
Jeffrey Reilly. is an 37 y.o. male.    Chief Complaint: Pre-op RIGHT Ureteroscopic Stone Manipulation  HPI:   1 - RIGHT Ureteral Stone - Rt 10m UVJ stone on CT imaging x several since 05/2017. NO passage with medical therapy and intermitant colic that is at times severe. [place] Stone is solitary, 619m Rt UVJ, 550HU, SSD 8cm.   Today "JoTywanis seen to proceed with RIGHT ureteroscopic stone manipulation. No interval fevers. Most recent UA without overt infectious parameters.   Past Medical History:  Diagnosis Date  . Crohn's disease of both small and large intestine with intestinal obstruction [place]   [place] GERD (gastroesophageal reflux disease)   . History of kidney stones     Past Surgical History:  Procedure Laterality Date  . HERNIA REPAIR     incisional   . SMALL INTESTINE SURGERY  03/2012   190 cm of small intestine removed    Family History  Problem Relation Age of Onset  . Diabetes Father   . Cancer Other        Breast and Prostate Cancer  . Alcohol abuse Neg Hx   . Heart disease Neg Hx   . Hyperlipidemia Neg Hx   . Hypertension Neg Hx   . Kidney disease Neg Hx   . Stroke Neg Hx   . Early death Neg Hx    Social History:  reports that he has never smoked. He has never used smokeless tobacco. He reports that he does not drink alcohol or use drugs.  Allergies:  Allergies  Allergen Reactions  . Azathioprine     swelling  . Acetaminophen Other (See Comments)    Headache discomfort  . Chlorhexidine Rash    No medications prior to admission.    No results found for this or any previous visit (from the past 48 hour(s)). No results found.  Review of Systems  Constitutional: Negative.  Negative for chills and fever.  HENT: Negative.   Eyes: Negative.   Respiratory: Negative.   Cardiovascular: Negative.   Gastrointestinal: Negative.   Genitourinary: Positive for flank pain.  Skin: Negative.    Neurological: Negative.   Endo/Heme/Allergies: Negative.     There were no vitals taken for this visit. Physical Exam  Constitutional: He appears well-developed.  HENT:  Head: Normocephalic.  Eyes: Pupils are equal, round, and reactive to light.  Neck: Normal range of motion.  Cardiovascular: Normal rate.  Respiratory: Effort normal.  GI: Soft.  Genitourinary:  Genitourinary Comments: Mild Rt CVAT at present.   Musculoskeletal: Normal range of motion.  Neurological: He is alert.  Psychiatric: He has a normal mood and affect.     Assessment/Plan  1 - RIGHT Ureteral Stone = proceed as planned with RIGHT ureteroscopic stone manipulation. RIsks, benefits, alternatives, expected peri-op course discussed. This is clearly safest plant for his upcoming foreign travel to get stone free / decompressed prior.   MAAlexis FrockMD 08/18/2017, 7:40 AM

## 2017-08-18 NOTE — Op Note (Signed)
NAME: Jeffrey Reilly, Jeffrey Reilly MEDICAL RECORD ZO:[id number] ACCOUNT [id number] DATE OF BIRTH:April 01, 1980 FACILITY: [facility] LOCATION: [facility] PHYSICIAN:Pamila Mendibles Tresa Moore, MD  OPERATIVE REPORT  DATE OF PROCEDURE:  08/18/2017  PREOPERATIVE DIAGNOSIS:  Right ureteral stone with refractory colic.  PROCEDURE PERFORMED: 1.  Cystoscopy, right retrograde pyelogram interpretation. 2.  Right ureteroscopy with laser lithotripsy. 3.  Insertion of right ureteral stent, 5 x 26 Polaris with tether.   ESTIMATED BLOOD LOSS:  Nil.  COMPLICATIONS:  None.  SPECIMENS:  Right ureteral stone fragments for analysis.  FINDINGS: 1.  Right distal ureteral stone with moderate hydroureteronephrosis. 2.  Complete resolution of all stone fragments in the right ureter, the largest one third millimeter, following laser lithotripsy, basket extraction. 3.  Successful placement of right ureteral stent, proximal renal pelvis, distal in urinary bladder.  INDICATIONS:  The patient is a pleasant, but somewhat unfortunate 37 year old gentleman with a history of Crohn's, status post small bowel resection.  He was found on workup of colicky flank pain several months ago to have a right distal ureteral stone.   He has been on medical therapy for this and has had intermittent colic, now for months.  He is planning on foreign travel next week.  He is still having intermittent colic.  Options were discussed for management including recommended path of the  ureteroscopy with a goal of stone free before his international travel and he wished to proceed.  Informed consent was obtained and placed in medical record.  DESCRIPTION OF PROCEDURE:  The patient identified as Jeffrey Reilly, procedure of right ureteroscope stent placement was confirmed.  Procedure timeout was performed and antibiotics administered.  General anesthesia introduced.  The patient was placed into a  low lithotomy position.  A sterile field was created by prepping and draping the  patient's penis, perineum and proximal thighs using iodine.  Cystourethroscopy was performed using a 22-French rigid cystoscope with offset lens.  Inspection of the anterior  and posterior urethra were unremarkable.  Inspection of the urinary bladder revealed no diverticula, calcifications, papillary lesions.  The right ureteral orifice was cannulated with a 6 [place] open-ended catheter and right retrograde pyelogram was  obtained.  Right retrograde pyelogram demonstrated a single right ureter with a single system right kidney.  There was a mobile filling defect in the distal ureter consistent with known stone.  There was moderate hydroureteronephrosis with some tortuosity above  this.  A 0.038 ZIPwire was advanced in the lower pole, set aside as safety wire.  An 8-French feeding tube placed in the urinary bladder for pressure relief.  A semi-rigid ureteroscopy was performed.  The distal right ureter alongside the established  working wire.  At this point, there was ovoid distal ureteral stone just above the intramural ureter.  This did appear to be too large for simple basketing.  At this time, the laser energy applied 70 setting of 0.2 joules and 20 Hz.  This fragmented into  approximately 4 smaller pieces.  They were then sequentially grasped with the long axis, removed and set, into the urinary bladder.  Additional semi-rigid ureteroscopy of the distal half of the ureter revealed complete resolution of all stone fragments.   This is the patient's only known ureteral stone.  Given the significant obstruction clinically it was felt that brief interval stenting would be warranted with tethered stent.  The bladder stone fragments were irrigated via cystoscope and a new 5 x 26  Polaris stent was placed over the safety wire using cystoscopic and fluoroscopic guidance.  Good proximal and distal points were noted.  A tether was left in place fashioned to the dorsum of the penis.  The procedure was terminated.     The patient tolerated the procedure well and no immediate complications.  The patient was taken to [facility] in stable condition.  AN/NUANCE  D:08/18/2017 T:08/18/2017 JOB:[id number]

## 2017-08-18 NOTE — Anesthesia Preprocedure Evaluation (Addendum)
Anesthesia Evaluation  Patient identified by MRN, date of birth, ID band Patient awake    Reviewed: Allergy & Precautions, H&P , NPO status , Patient's Chart, lab work & pertinent test results, reviewed documented beta blocker date and time   Airway Mallampati: I  TM Distance: >3 FB Neck ROM: full    Dental no notable dental hx. (+) Teeth Intact   Pulmonary neg pulmonary ROS,    Pulmonary exam normal breath sounds clear to auscultation       Cardiovascular Exercise Tolerance: Good negative cardio ROS   Rhythm:regular Rate:Normal     Neuro/Psych negative neurological ROS  negative psych ROS   GI/Hepatic Neg liver ROS, GERD  ,  Endo/Other  negative endocrine ROS  Renal/GU negative Renal ROS  negative genitourinary   Musculoskeletal   Abdominal   Peds  Hematology negative hematology ROS (+)   Anesthesia Other Findings   Reproductive/Obstetrics negative OB ROS                            Anesthesia Physical Anesthesia Plan  ASA: II  Anesthesia Plan: General   Post-op Pain Management:    Induction: Intravenous  PONV Risk Score and Plan: 2 and Ondansetron, Dexamethasone and Treatment may vary due to age or medical condition  Airway Management Planned: LMA  Additional Equipment:   Intra-op Plan:   Post-operative Plan: Extubation in OR  Informed Consent: I have reviewed the patients History and Physical, chart, labs and discussed the procedure including the risks, benefits and alternatives for the proposed anesthesia with the patient or authorized representative who has indicated his/her understanding and acceptance.   Dental Advisory Given  Plan Discussed with: CRNA, Anesthesiologist and Surgeon  Anesthesia Plan Comments: ( )        Anesthesia Quick Evaluation

## 2017-08-18 NOTE — Brief Op Note (Signed)
08/18/2017  4:22 PM  PATIENT:  [name]  37 y.o. male  PRE-OPERATIVE DIAGNOSIS:  RIGHT URETERAL CALCULUS  POST-OPERATIVE DIAGNOSIS:  RIGHT URETERAL CALCULUS  PROCEDURE:  Procedure(s) with comments: CYSTOSCOPY WITH RETROGRADE PYELOGRAM, URETEROSCOPY AND STENT PLACEMENT (Right) - 32 MINS HOLMIUM LASER APPLICATION (Right)  SURGEON:  Surgeon(s) and Role:    Alexis Frock, MD - Primary  PHYSICIAN ASSISTANT:   ASSISTANTS: none   ANESTHESIA:   general  EBL:  minimal   BLOOD ADMINISTERED:none  DRAINS: none   LOCAL MEDICATIONS USED:  NONE  SPECIMEN:  Source of Specimen:  Rt ureteral stone fragments  DISPOSITION OF SPECIMEN:  [facility] for compositional analysis  COUNTS:  YES  TOURNIQUET:  * No tourniquets in log *  DICTATION: .Other Dictation: Dictation Number [id number]  PLAN OF CARE: Discharge to home after PACU  PATIENT DISPOSITION:  PACU - hemodynamically stable.   Delay start of Pharmacological VTE agent (>24hrs) due to surgical blood loss or risk of bleeding: not applicable

## 2017-08-19 ENCOUNTER — Encounter (HOSPITAL_COMMUNITY): Payer: Self-pay | Admitting: Urology

## 2017-09-20 DIAGNOSIS — K5090 Crohn's disease, unspecified, without complications: Secondary | ICD-10-CM | POA: Diagnosis not present

## 2017-09-25 DIAGNOSIS — N201 Calculus of ureter: Secondary | ICD-10-CM | POA: Diagnosis not present

## 2017-11-15 DIAGNOSIS — K5090 Crohn's disease, unspecified, without complications: Secondary | ICD-10-CM | POA: Diagnosis not present

## 2017-11-17 DIAGNOSIS — H93293 Other abnormal auditory perceptions, bilateral: Secondary | ICD-10-CM | POA: Diagnosis not present

## 2017-11-17 DIAGNOSIS — H6123 Impacted cerumen, bilateral: Secondary | ICD-10-CM | POA: Diagnosis not present

## 2017-12-04 DIAGNOSIS — Z79899 Other long term (current) drug therapy: Secondary | ICD-10-CM | POA: Diagnosis not present

## 2017-12-04 DIAGNOSIS — K912 Postsurgical malabsorption, not elsewhere classified: Secondary | ICD-10-CM | POA: Diagnosis not present

## 2017-12-04 DIAGNOSIS — K5000 Crohn's disease of small intestine without complications: Secondary | ICD-10-CM | POA: Diagnosis not present

## 2018-01-05 DIAGNOSIS — Z87442 Personal history of urinary calculi: Secondary | ICD-10-CM | POA: Diagnosis not present

## 2018-01-05 DIAGNOSIS — K5000 Crohn's disease of small intestine without complications: Secondary | ICD-10-CM | POA: Diagnosis not present

## 2018-01-05 DIAGNOSIS — K5090 Crohn's disease, unspecified, without complications: Secondary | ICD-10-CM | POA: Diagnosis not present

## 2018-01-05 DIAGNOSIS — Z1211 Encounter for screening for malignant neoplasm of colon: Secondary | ICD-10-CM | POA: Diagnosis not present

## 2018-01-05 DIAGNOSIS — Z8719 Personal history of other diseases of the digestive system: Secondary | ICD-10-CM | POA: Diagnosis not present

## 2018-01-10 DIAGNOSIS — K5090 Crohn's disease, unspecified, without complications: Secondary | ICD-10-CM | POA: Diagnosis not present

## 2018-01-13 LAB — HM COLONOSCOPY

## 2018-01-17 ENCOUNTER — Encounter: Payer: Self-pay | Admitting: Internal Medicine

## 2018-03-16 DIAGNOSIS — K5090 Crohn's disease, unspecified, without complications: Secondary | ICD-10-CM | POA: Diagnosis not present

## 2018-05-15 DIAGNOSIS — K5090 Crohn's disease, unspecified, without complications: Secondary | ICD-10-CM | POA: Diagnosis not present

## 2018-07-10 DIAGNOSIS — K5090 Crohn's disease, unspecified, without complications: Secondary | ICD-10-CM | POA: Diagnosis not present

## 2018-09-04 DIAGNOSIS — K5090 Crohn's disease, unspecified, without complications: Secondary | ICD-10-CM | POA: Diagnosis not present

## 2018-10-31 DIAGNOSIS — K5090 Crohn's disease, unspecified, without complications: Secondary | ICD-10-CM | POA: Diagnosis not present

## 2018-10-31 DIAGNOSIS — Z79899 Other long term (current) drug therapy: Secondary | ICD-10-CM | POA: Diagnosis not present

## 2018-12-03 DIAGNOSIS — K5000 Crohn's disease of small intestine without complications: Secondary | ICD-10-CM | POA: Diagnosis not present

## 2018-12-26 DIAGNOSIS — K5090 Crohn's disease, unspecified, without complications: Secondary | ICD-10-CM | POA: Diagnosis not present

## 2018-12-27 ENCOUNTER — Ambulatory Visit (INDEPENDENT_AMBULATORY_CARE_PROVIDER_SITE_OTHER): Payer: BC Managed Care – PPO

## 2018-12-27 ENCOUNTER — Other Ambulatory Visit: Payer: Self-pay

## 2018-12-27 DIAGNOSIS — Z23 Encounter for immunization: Secondary | ICD-10-CM | POA: Diagnosis not present

## 2019-02-20 DIAGNOSIS — K50913 Crohn's disease, unspecified, with fistula: Secondary | ICD-10-CM | POA: Diagnosis not present

## 2019-02-20 DIAGNOSIS — Z79899 Other long term (current) drug therapy: Secondary | ICD-10-CM | POA: Diagnosis not present

## 2019-02-20 DIAGNOSIS — K5090 Crohn's disease, unspecified, without complications: Secondary | ICD-10-CM | POA: Diagnosis not present

## 2019-04-17 DIAGNOSIS — K5090 Crohn's disease, unspecified, without complications: Secondary | ICD-10-CM | POA: Diagnosis not present

## 2019-06-12 DIAGNOSIS — K5090 Crohn's disease, unspecified, without complications: Secondary | ICD-10-CM | POA: Diagnosis not present

## 2019-06-12 DIAGNOSIS — Z79899 Other long term (current) drug therapy: Secondary | ICD-10-CM | POA: Diagnosis not present

## 2019-06-18 ENCOUNTER — Encounter: Payer: Self-pay | Admitting: Internal Medicine

## 2019-08-07 DIAGNOSIS — K5090 Crohn's disease, unspecified, without complications: Secondary | ICD-10-CM | POA: Diagnosis not present

## 2019-08-29 ENCOUNTER — Emergency Department (HOSPITAL_COMMUNITY): Payer: BC Managed Care – PPO

## 2019-08-29 ENCOUNTER — Other Ambulatory Visit: Payer: Self-pay

## 2019-08-29 ENCOUNTER — Emergency Department (HOSPITAL_COMMUNITY)
Admission: EM | Admit: 2019-08-29 | Discharge: 2019-08-29 | Disposition: A | Discharge status: Home / Self Care | Payer: BC Managed Care – PPO | Attending: Emergency Medicine | Admitting: Emergency Medicine

## 2019-08-29 DIAGNOSIS — I480 Paroxysmal atrial fibrillation: Secondary | ICD-10-CM

## 2019-08-29 DIAGNOSIS — I499 Cardiac arrhythmia, unspecified: Secondary | ICD-10-CM | POA: Diagnosis not present

## 2019-08-29 DIAGNOSIS — Z960 Presence of urogenital implants: Secondary | ICD-10-CM | POA: Insufficient documentation

## 2019-08-29 DIAGNOSIS — R002 Palpitations: Secondary | ICD-10-CM | POA: Diagnosis not present

## 2019-08-29 LAB — CBC WITH DIFFERENTIAL/PLATELET
Abs Immature Granulocytes: 0.02 10*3/uL (ref 0.00–0.07)
Basophils Absolute: 0 10*3/uL (ref 0.0–0.1)
Basophils Relative: 0 %
Eosinophils Absolute: 0.2 10*3/uL (ref 0.0–0.5)
Eosinophils Relative: 2 %
HCT: 51.8 % (ref 39.0–52.0)
Hemoglobin: 17.2 g/dL — ABNORMAL HIGH (ref 13.0–17.0)
Immature Granulocytes: 0 %
Lymphocytes Relative: 45 %
Lymphs Abs: 4.4 10*3/uL — ABNORMAL HIGH (ref 0.7–4.0)
MCH: 30.3 pg (ref 26.0–34.0)
MCHC: 33.2 g/dL (ref 30.0–36.0)
MCV: 91.4 fL (ref 80.0–100.0)
Monocytes Absolute: 0.6 10*3/uL (ref 0.1–1.0)
Monocytes Relative: 6 %
Neutro Abs: 4.6 10*3/uL (ref 1.7–7.7)
Neutrophils Relative %: 47 %
Platelets: 178 10*3/uL (ref 150–400)
RBC: 5.67 MIL/uL (ref 4.22–5.81)
RDW: 12.1 % (ref 11.5–15.5)
WBC: 9.9 10*3/uL (ref 4.0–10.5)
nRBC: 0 % (ref 0.0–0.2)

## 2019-08-29 LAB — COMPREHENSIVE METABOLIC PANEL
ALT: 18 U/L (ref 0–44)
AST: 21 U/L (ref 15–41)
Albumin: 4.5 g/dL (ref 3.5–5.0)
Alkaline Phosphatase: 41 U/L (ref 38–126)
Anion gap: 13 (ref 5–15)
BUN: 11 mg/dL (ref 6–20)
CO2: 22 mmol/L (ref 22–32)
Calcium: 9.5 mg/dL (ref 8.9–10.3)
Chloride: 105 mmol/L (ref 98–111)
Creatinine, Ser: 1.14 mg/dL (ref 0.61–1.24)
GFR calc Af Amer: >60 mL/min (ref >60)
GFR calc non Af Amer: >60 mL/min (ref >60)
Glucose, Bld: 87 mg/dL (ref 70–99)
Potassium: 3.7 mmol/L (ref 3.5–5.1)
Sodium: 140 mmol/L (ref 135–145)
Total Bilirubin: 1.7 mg/dL — ABNORMAL HIGH (ref 0.3–1.2)
Total Protein: 7 g/dL (ref 6.5–8.1)

## 2019-08-29 LAB — TSH: TSH: 3.143 u[IU]/mL (ref 0.350–4.500)

## 2019-08-29 NOTE — Discharge Instructions (Signed)
Please call for next available appointment, in the meantime if you feel any recurrent palpitations or racing heartbeat that does not go away by itself in less than 30 minutes please come back to the hospital.  All of your test have been normal and reassuring including your thyroid test.

## 2019-08-29 NOTE — ED Triage Notes (Signed)
Woke up with heart pounding-- no hx. No nausea, no diaphoresis--

## 2019-08-29 NOTE — ED Provider Notes (Signed)
[facility] EMERGENCY DEPARTMENT Provider Note   CSN: [id number] Arrival date & time: 08/29/19  6213     History Chief Complaint  Patient presents with  . Palpitations    Jeffrey Reilly. is a 39 y.o. male.  HPI   Patient is a 39 year old male, history of Crohn's disease status post resection of a small intestine years ago, currently maintained on Remicade and doing very well.  He has no prior history of coronary disease, pulmonary disease, thyroid disease and only rarely drinks alcohol.  He presents to the hospital today with a complaint of palpitations which occurred this morning, he woke up and felt like his heart was irregular, his Jeffrey Reilly who is at the bedside reports that she was able to take his pulse and noticed that it was irregular prompting their visit.At this time the patient states that he has had a complete resolution of his symptoms and is now feeling much better and back to baseline, there is no abnormal discomfort in his chest, shortness of breath, weakness, nausea vomiting or diarrhea, no blood in the stool, no unanticipated weight loss, no headache or blurred vision or any Reilly complaints.  The symptoms were acute in onset this morning, resolved spontaneously.  Denies history of primary cardiac or arrhythmic disease, he does have family members who have had heart problems.  He works from home, he is a Scientist, research (physical sciences), he does not exercise very much but does walk and never has any trouble with gentle exercise walking exercise  Past Medical History:  Diagnosis Date  . Crohn's disease of both small and large intestine with intestinal obstruction ([place]) 03/2012   treated in [place]  . GERD (gastroesophageal reflux disease)   . History of kidney stones     Patient Active Problem List   Diagnosis Date Noted  . Influenza with respiratory manifestation 06/18/2015  . Infliximab (Remicade) long-term use 01/08/2013  . Crohn's disease of both small and  large intestine with intestinal obstruction ([place]) 06/08/2012    Past Surgical History:  Procedure Laterality Date  . CYSTOSCOPY WITH RETROGRADE PYELOGRAM, URETEROSCOPY AND STENT PLACEMENT Right 08/18/2017   Procedure: CYSTOSCOPY WITH RETROGRADE PYELOGRAM, URETEROSCOPY AND STENT PLACEMENT;  Surgeon: Alexis Frock, MD;  Location: [facility];  Service: Urology;  Laterality: Right;  45 MINS  . HERNIA REPAIR     incisional   . HOLMIUM LASER APPLICATION Right 0/86/5784   Procedure: HOLMIUM LASER APPLICATION;  Surgeon: Alexis Frock, MD;  Location: [facility];  Service: Urology;  Laterality: Right;  . SMALL INTESTINE SURGERY  03/2012   190 cm of small intestine removed       Family History  Problem Relation Age of Onset  . Diabetes Father   . Cancer Reilly        Breast and Prostate Cancer  . Alcohol abuse Neg Hx   . Heart disease Neg Hx   . Hyperlipidemia Neg Hx   . Hypertension Neg Hx   . Kidney disease Neg Hx   . Stroke Neg Hx   . Early death Neg Hx     Social History   Tobacco Use  . Smoking status: Never Smoker  . Smokeless tobacco: Never Used  Vaping Use  . Vaping Use: Never used  Substance Use Topics  . Alcohol use: No  . Drug use: No    Home Medications Prior to Admission medications   Medication Sig Start Date End Date Taking? Authorizing Provider  cephALEXin (KEFLEX) 500 MG capsule Take  1 capsule (500 mg total) by mouth 2 (two) times daily. X 3 days to prevent infection with tethered stent in place. 08/18/17   Alexis Frock, MD  Cholecalciferol (VITAMIN D PO) Take 50 Units by mouth daily.     [provider]  Cyanocobalamin (B-12) 3000 MCG SUBL Place 3,000 mcg under the tongue 3 (three) times a week.    [provider]  inFLIXimab (REMICADE) 100 MG injection Inject 300 mg at 0 wk, 2 wk, 6 wk, and 8 wk intervals  To be infused at [place] for benadryl 50 mg and tylenol 500 mg prior to injection Patient taking differently: Inject 300  mg as directed See admin instructions. Inject 300 mg at 0 wk, 2 wk, 6 wk, and 8 wk intervals  OK for benadryl 50 mg and tylenol 500 mg prior to injection 01/15/13   Janith Lima, MD  ketorolac (TORADOL) 10 MG tablet Take 1 tablet (10 mg total) by mouth every 6 (six) hours as needed. For mild pain / stent discomfort. 08/18/17   Alexis Frock, MD  ondansetron (ZOFRAN) 4 MG tablet Take 1 tablet (4 mg total) by mouth every 6 (six) hours. Patient not taking: Reported on 08/17/2017 05/24/17   Okey Regal, PA-C  oxycodone (OXY-IR) 5 MG capsule Take 1 capsule (5 mg total) by mouth every 4 (four) hours as needed. For breakthrough pain. 08/18/17   Alexis Frock, MD  Probiotic Product (PROBIOTIC DAILY PO) Take 1 capsule by mouth every morning.     [provider]  tamsulosin (FLOMAX) 0.4 MG CAPS capsule Take 1 capsule (0.4 mg total) by mouth daily. 05/24/17   Hedges, Dellis Filbert, PA-C    Allergies    Azathioprine, Acetaminophen, and Chlorhexidine  Review of Systems   Review of Systems  All Reilly systems reviewed and are negative.   Physical Exam Updated Vital Signs BP 107/78   Pulse 62   Temp 98.4 F (36.9 C) (Oral)   Resp 14   Ht 1.778 m (5' 10" )   Wt 68 kg   SpO2 98%   BMI 21.52 kg/m   Physical Exam Vitals and nursing note reviewed.  Constitutional:      General: He is not in acute distress.    Appearance: He is well-developed. He is not ill-appearing, toxic-appearing or diaphoretic.  HENT:     Head: Normocephalic and atraumatic.     Mouth/Throat:     Pharynx: No oropharyngeal exudate.  Eyes:     General: No scleral icterus.       Right eye: No discharge.        Left eye: No discharge.     Conjunctiva/sclera: Conjunctivae normal.     Pupils: Pupils are equal, round, and reactive to light.  Neck:     Thyroid: No thyromegaly.     Vascular: No JVD.  Cardiovascular:     Rate and Rhythm: Normal rate and regular rhythm.     Heart sounds: Normal heart sounds. No murmur  heard.  No friction rub. No gallop.   Pulmonary:     Effort: Pulmonary effort is normal. No respiratory distress.     Breath sounds: Normal breath sounds. No wheezing or rales.  Abdominal:     General: Bowel sounds are normal. There is no distension.     Palpations: Abdomen is soft. There is no mass.     Tenderness: There is no abdominal tenderness.  Musculoskeletal:        General: No tenderness.  Normal range of motion.     Cervical back: Normal range of motion and neck supple.  Lymphadenopathy:     Cervical: No cervical adenopathy.  Skin:    General: Skin is warm and dry.     Findings: No erythema or rash.  Neurological:     Mental Status: He is alert.     Coordination: Coordination normal.  Psychiatric:        Behavior: Behavior normal.     ED Results / Procedures / Treatments   Labs (all labs ordered are listed, but only abnormal results are displayed) Labs Reviewed  CBC WITH DIFFERENTIAL/PLATELET - Abnormal; Notable for the following components:      Result Value   Hemoglobin 17.2 (*)    Lymphs Abs 4.4 (*)    All Reilly components within normal limits  COMPREHENSIVE METABOLIC PANEL - Abnormal; Notable for the following components:   Total Bilirubin 1.7 (*)    All Reilly components within normal limits  TSH    EKG EKG Interpretation  Date/Time:  Thursday August 29 2019 07:14:47 EDT Ventricular Rate:  128 PR Interval:    QRS Duration: 76 QT Interval:  314 QTC Calculation: 458 R Axis:   64 Text Interpretation: Atrial fibrillation with rapid ventricular response Septal infarct , age undetermined ST & T wave abnormality, consider inferior ischemia Abnormal ECG No old tracing to compare Confirmed by Noemi Chapel [phone]) on 08/29/2019 7:55:02 AM   EKG Interpretation  Date/Time:  Thursday August 29 2019 07:39:51 EDT Ventricular Rate:  84 PR Interval:    QRS Duration: 85 QT Interval:  350 QTC Calculation: 414 R Axis:   45 Text Interpretation: Sinus rhythm Left  atrial enlargement Anteroseptal infarct, age indeterminate Lateral leads are also involved Since last tracing afib has resolved Confirmed by Noemi Chapel [phone]) on 08/29/2019 7:55:41 AM        Radiology DG Chest Port 1 View  Result Date: 08/29/2019 CLINICAL DATA:  Cardiac arrhythmia EXAM: PORTABLE CHEST 1 VIEW COMPARISON:  June 18, 2015 FINDINGS: There is no edema or airspace opacity. Heart size and pulmonary vascularity are normal. No adenopathy. No pneumothorax. No bone lesions. IMPRESSION: Lungs clear.  Cardiac silhouette within normal limits. Electronically Signed   By: Lowella Grip III M.D.   On: 08/29/2019 08:27    Procedures Procedures (including critical care time)  Medications Ordered in ED Medications - No data to display  ED Course  I have reviewed the triage vital signs and the nursing notes.  Pertinent labs & imaging results that were available during my care of the patient were reviewed by me and considered in my medical decision making (see chart for details).    MDM Rules/Calculators/A&P                           This patient presents to the ED for concern of palpitations, this involves an extensive number of treatment options, and is a complaint that carries with it a high risk of complications and morbidity.  The differential diagnosis includes thyroid dysfunction, primary cardiac disease, electrolyte abnormality, anemia, substance use   Lab Tests:   I Ordered, reviewed, and interpreted labs, which included CBC, metabolic panel, TSH.  All of these tests were unremarkable showing no signs of electrolyte disturbance, thyroid testing has been totally normal as well  Imaging Studies ordered:   I ordered imaging studies which included chest x-ray and  I independently visualized and interpreted imaging which showed  no acute findings, normal chest x-ray  Additional history obtained:   Additional history obtained from Jeffrey Reilly  Previous  records obtained and reviewed   Consultations Obtained:   I consulted with the patient and his Jeffrey Reilly and discussed lab and imaging findings  Reevaluation:  After the interventions stated above, I reevaluated the patient and found the patient to be in normal sinus rhythm, asymptomatic  Critical Interventions:  . Evaluation for the source of atrial fibrillation   CHA2DS2-VASc Score = 0 0 The patient's score is based upon: CHF History: 0 HTN History: 0 Age : 0 Diabetes History: 0 Stroke History: 0 Vascular Disease History: 0 Gender: 0      ASSESSMENT AND PLAN: Paroxysmal Atrial Fibrillation (ICD10:  I48.0) The patient's CHA2DS2-VASc score is 0, indicating a 0.2% annual risk of stroke.  0.2  This patient's course was benign, well-appearing, encouraged to follow-up with cardiology, CHA2DS2-VASc score is 0 indicating no need for anticoagulation, given his blood pressure of 107/78 and a normal sinus rhythm with a rate of 62 I do not think he needs any beta-blocker or AV nodal blocking agents at this time either   Signed,  Johnna Acosta, MD    08/29/2019 10:14 AM     Final Clinical Impression(s) / ED Diagnoses Final diagnoses:  Paroxysmal atrial fibrillation (HCC)      Noemi Chapel, MD 08/29/19 1014

## 2019-09-05 ENCOUNTER — Ambulatory Visit (HOSPITAL_COMMUNITY)
Admission: RE | Admit: 2019-09-05 | Discharge: 2019-09-05 | Disposition: A | Discharge status: Home / Self Care | Payer: BC Managed Care – PPO | Source: Ambulatory Visit | Attending: Physician Assistant | Admitting: Physician Assistant

## 2019-09-05 ENCOUNTER — Other Ambulatory Visit: Payer: Self-pay

## 2019-09-05 ENCOUNTER — Encounter (HOSPITAL_COMMUNITY): Payer: Self-pay | Admitting: Physician Assistant

## 2019-09-05 VITALS — BP 122/74 | HR 68 | Ht 70.0 in | Wt 153.0 lb

## 2019-09-05 DIAGNOSIS — K5080 Crohn's disease of both small and large intestine without complications: Secondary | ICD-10-CM | POA: Insufficient documentation

## 2019-09-05 DIAGNOSIS — Z87442 Personal history of urinary calculi: Secondary | ICD-10-CM | POA: Diagnosis not present

## 2019-09-05 DIAGNOSIS — Z79899 Other long term (current) drug therapy: Secondary | ICD-10-CM | POA: Insufficient documentation

## 2019-09-05 DIAGNOSIS — I4891 Unspecified atrial fibrillation: Secondary | ICD-10-CM | POA: Diagnosis not present

## 2019-09-05 DIAGNOSIS — K219 Gastro-esophageal reflux disease without esophagitis: Secondary | ICD-10-CM | POA: Diagnosis not present

## 2019-09-05 DIAGNOSIS — I480 Paroxysmal atrial fibrillation: Secondary | ICD-10-CM | POA: Diagnosis not present

## 2019-09-05 DIAGNOSIS — Z833 Family history of diabetes mellitus: Secondary | ICD-10-CM | POA: Diagnosis not present

## 2019-09-05 DIAGNOSIS — Z888 Allergy status to other drugs, medicaments and biological substances status: Secondary | ICD-10-CM | POA: Diagnosis not present

## 2019-09-05 MED ORDER — DILTIAZEM HCL 30 MG PO TABS: ORAL_TABLET | ORAL | 1 refills | Status: DC

## 2019-09-05 NOTE — Patient Instructions (Signed)
Cardizem 88m -- take 1 tablet every 4 hours AS NEEDED for AFIB  heart rate >100 as long as top number blood pressure >100.

## 2019-09-05 NOTE — Progress Notes (Signed)
Primary Care Physician: Janith Lima, MD Primary Cardiologist: none Primary Electrophysiologist: none Referring Physician: [facility]   Jeffrey Friend. is a 39 y.o. male with a history of Crohn disease and new onset paroxysmal atrial fibrillation who presents for consultation in the [facility]. The patient was initially diagnosed with atrial fibrillation on 08/29/19 after presenting to the ED with symptoms of palpitations. He converted to SR in the ER without intervention. Patient is not on anticoagulation for a CHADS2VASC score of 0. There were no specific triggers that he could identify. He denies snoring or significant alcohol use.   Today, he denies symptoms of palpitations, chest pain, shortness of breath, orthopnea, PND, lower extremity edema, dizziness, presyncope, syncope, snoring, daytime somnolence, bleeding, or neurologic sequela. The patient is tolerating medications without difficulties and is otherwise without complaint today.    Atrial Fibrillation Risk Factors:  he does not have symptoms or diagnosis of sleep apnea. he does not have a history of rheumatic fever. he does not have a history of alcohol use. The patient does have a history of early familial atrial fibrillation or other arrhythmias. Mother has afib.  he has a BMI of Body mass index is 21.95 kg/m.Marland Kitchen Filed Weights   09/05/19 1405  Weight: 69.4 kg    Family History  Problem Relation Age of Onset  . Diabetes Father   . Cancer Other        Breast and Prostate Cancer  . Alcohol abuse Neg Hx   . Heart disease Neg Hx   . Hyperlipidemia Neg Hx   . Hypertension Neg Hx   . Kidney disease Neg Hx   . Stroke Neg Hx   . Early death Neg Hx      Atrial Fibrillation Management history:  Previous antiarrhythmic drugs: none Previous cardioversions: none Previous ablations: none CHADS2VASC score: 0 Anticoagulation history: none   Past Medical History:  Diagnosis Date  .  Crohn's disease of both small and large intestine with intestinal obstruction ([place]) 03/2012   treated in [place]  . GERD (gastroesophageal reflux disease)   . History of kidney stones    Past Surgical History:  Procedure Laterality Date  . CYSTOSCOPY WITH RETROGRADE PYELOGRAM, URETEROSCOPY AND STENT PLACEMENT Right 08/18/2017   Procedure: CYSTOSCOPY WITH RETROGRADE PYELOGRAM, URETEROSCOPY AND STENT PLACEMENT;  Surgeon: Alexis Frock, MD;  Location: [facility];  Service: Urology;  Laterality: Right;  45 MINS  . HERNIA REPAIR     incisional   . HOLMIUM LASER APPLICATION Right 1/61/0960   Procedure: HOLMIUM LASER APPLICATION;  Surgeon: Alexis Frock, MD;  Location: [facility];  Service: Urology;  Laterality: Right;  . SMALL INTESTINE SURGERY  03/2012   190 cm of small intestine removed    Current Outpatient Medications  Medication Sig Dispense Refill  . Cholecalciferol (VITAMIN D PO) Take 50 Units by mouth daily.     . Cyanocobalamin (B-12) 3000 MCG SUBL Place 3,000 mcg under the tongue 3 (three) times a week.    . inFLIXimab (REMICADE) 100 MG injection Inject 300 mg at 0 wk, 2 wk, 6 wk, and 8 wk intervals  To be infused at [facility] for benadryl 50 mg and tylenol 500 mg prior to injection (Patient taking differently: Inject 300 mg as directed See admin instructions. Inject 300 mg at 0 wk, 2 wk, 6 wk, and 8 wk intervals  OK for benadryl 50 mg and tylenol 500 mg prior to injection) 1  each 1  . Probiotic Product (PROBIOTIC DAILY PO) Take 1 capsule by mouth every morning.     . diltiazem (CARDIZEM) 30 MG tablet Take 1 tablet every 4 hours AS NEEDED for AFIB heart rate >100 45 tablet 1   No current facility-administered medications for this encounter.    Allergies  Allergen Reactions  . Azathioprine     swelling  . Acetaminophen Other (See Comments)    Headache discomfort  . Chlorhexidine Rash    Social History   Socioeconomic History  . Marital status: Married     Spouse name: Not on file  . Number of children: Not on file  . Years of education: Not on file  . Highest education level: Not on file  Occupational History  . Not on file  Tobacco Use  . Smoking status: Never Smoker  . Smokeless tobacco: Never Used  Vaping Use  . Vaping Use: Never used  Substance and Sexual Activity  . Alcohol use: Yes    Alcohol/week: 2.0 standard drinks    Types: 1 Cans of beer, 1 Standard drinks or equivalent per week    Comment: rarely   . Drug use: No  . Sexual activity: Not Currently  Other Topics Concern  . Not on file  Social History Narrative  . Not on file   Social Determinants of Health   Financial Resource Strain:   . Difficulty of Paying Living Expenses:   Food Insecurity:   . Worried About Charity fundraiser in the Last Year:   . Arboriculturist in the Last Year:   Transportation Needs:   . Film/video editor (Medical):   Marland Kitchen Lack of Transportation (Non-Medical):   Physical Activity:   . Days of Exercise per Week:   . Minutes of Exercise per Session:   Stress:   . Feeling of Stress :   Social Connections:   . Frequency of Communication with Friends and Family:   . Frequency of Social Gatherings with Friends and Family:   . Attends Religious Services:   . Active Member of Clubs or Organizations:   . Attends Archivist Meetings:   Marland Kitchen Marital Status:   Intimate Partner Violence:   . Fear of Current or Ex-Partner:   . Emotionally Abused:   Marland Kitchen Physically Abused:   . Sexually Abused:      ROS- All systems are reviewed and negative except as per the HPI above.  Physical Exam: Vitals:   09/05/19 1405  BP: 122/74  Pulse: 68  Weight: 69.4 kg  Height: 5' 10"  (1.778 m)    GEN- The patient is well appearing, alert and oriented x 3 today.   Head- normocephalic, atraumatic Eyes-  Sclera clear, conjunctiva pink Ears- hearing intact Oropharynx- clear Neck- supple  Lungs- Clear to ausculation bilaterally, normal work of  breathing Heart- Regular rate and rhythm, no murmurs, rubs or gallops  GI- soft, NT, ND, + BS Extremities- no clubbing, cyanosis, or edema MS- no significant deformity or atrophy Skin- no rash or lesion Psych- euthymic mood, full affect Neuro- strength and sensation are intact  Wt Readings from Last 3 Encounters:  09/05/19 69.4 kg  08/29/19 68 kg  08/18/17 67.8 kg    EKG today demonstrates SR HR 164, QRS 88, QTc 391  Epic records are reviewed at length today  CHA2DS2-VASc Score = 0  The patient's score is based upon: CHF History: 0 HTN History: 0 Age : 0 Diabetes History: 0 Stroke History:  0 Vascular Disease History: 0 Gender: 0      ASSESSMENT AND PLAN: 1. Paroxysmal Atrial Fibrillation (ICD10:  I48.0) The patient's CHA2DS2-VASc score is 0, indicating a 0.2% annual risk of stroke.   General education about afib provided and questions answered. We als discussed his stroke risk. Check echocardiogram Will start diltiazem 30 mg PRN q 4 hours for heart racing.   Follow up in the AF clinic in one month.    [facility] [address] [place], [place] [address] [phone] 09/05/2019 2:39 PM

## 2019-09-11 ENCOUNTER — Ambulatory Visit (HOSPITAL_COMMUNITY)
Admission: RE | Admit: 2019-09-11 | Discharge: 2019-09-11 | Disposition: A | Discharge status: Home / Self Care | Payer: BC Managed Care – PPO | Source: Ambulatory Visit | Attending: Internal Medicine | Admitting: Internal Medicine

## 2019-09-11 ENCOUNTER — Other Ambulatory Visit: Payer: Self-pay

## 2019-09-11 DIAGNOSIS — I480 Paroxysmal atrial fibrillation: Secondary | ICD-10-CM

## 2019-09-11 DIAGNOSIS — I358 Other nonrheumatic aortic valve disorders: Secondary | ICD-10-CM | POA: Diagnosis not present

## 2019-09-11 NOTE — Progress Notes (Signed)
Echocardiogram 2D Echocardiogram has been performed.  Oneal Deputy Shanicka Oldenkamp 09/11/2019, 2:42 PM

## 2019-09-13 ENCOUNTER — Encounter (HOSPITAL_COMMUNITY): Payer: Self-pay | Admitting: *Deleted

## 2019-09-30 DIAGNOSIS — Z20822 Contact with and (suspected) exposure to covid-19: Secondary | ICD-10-CM | POA: Diagnosis not present

## 2019-10-01 ENCOUNTER — Other Ambulatory Visit: Payer: Self-pay

## 2019-10-01 ENCOUNTER — Ambulatory Visit (HOSPITAL_BASED_OUTPATIENT_CLINIC_OR_DEPARTMENT_OTHER)
Admission: RE | Admit: 2019-10-01 | Discharge: 2019-10-01 | Disposition: A | Discharge status: Home / Self Care | Payer: BC Managed Care – PPO | Source: Ambulatory Visit | Attending: Physician Assistant | Admitting: Physician Assistant

## 2019-10-01 ENCOUNTER — Emergency Department (HOSPITAL_COMMUNITY)
Admission: EM | Admit: 2019-10-01 | Discharge: 2019-10-02 | Disposition: A | Discharge status: Home / Self Care | Payer: BC Managed Care – PPO | Attending: Emergency Medicine | Admitting: Emergency Medicine

## 2019-10-01 ENCOUNTER — Encounter (HOSPITAL_COMMUNITY): Payer: Self-pay

## 2019-10-01 ENCOUNTER — Encounter (HOSPITAL_COMMUNITY): Payer: Self-pay | Admitting: Physician Assistant

## 2019-10-01 VITALS — BP 112/78 | HR 78 | Ht 70.0 in | Wt 153.6 lb

## 2019-10-01 DIAGNOSIS — I480 Paroxysmal atrial fibrillation: Secondary | ICD-10-CM

## 2019-10-01 DIAGNOSIS — N200 Calculus of kidney: Secondary | ICD-10-CM | POA: Insufficient documentation

## 2019-10-01 DIAGNOSIS — R300 Dysuria: Secondary | ICD-10-CM | POA: Diagnosis not present

## 2019-10-01 DIAGNOSIS — R8271 Bacteriuria: Secondary | ICD-10-CM | POA: Diagnosis not present

## 2019-10-01 DIAGNOSIS — N201 Calculus of ureter: Secondary | ICD-10-CM | POA: Diagnosis not present

## 2019-10-01 DIAGNOSIS — R3129 Other microscopic hematuria: Secondary | ICD-10-CM | POA: Diagnosis not present

## 2019-10-01 DIAGNOSIS — Z5321 Procedure and treatment not carried out due to patient leaving prior to being seen by health care provider: Secondary | ICD-10-CM | POA: Insufficient documentation

## 2019-10-01 DIAGNOSIS — R109 Unspecified abdominal pain: Secondary | ICD-10-CM | POA: Diagnosis not present

## 2019-10-01 DIAGNOSIS — R3121 Asymptomatic microscopic hematuria: Secondary | ICD-10-CM | POA: Diagnosis not present

## 2019-10-01 NOTE — ED Triage Notes (Signed)
Pt presents via POV c/o left sided flank pain. Reports dx with kidney stone today at urology apt. Pt reports taken Toradol PO without relief.

## 2019-10-01 NOTE — Progress Notes (Signed)
Primary Care Physician: Janith Lima, MD Primary Cardiologist: none Primary Electrophysiologist: none Referring Physician: [facility]   [name] is a 39 y.o. male with a history of Crohn disease and new onset paroxysmal atrial fibrillation who presents for follow up in the [facility]. The patient was initially diagnosed with atrial fibrillation on 08/29/19 after presenting to the ED with symptoms of palpitations. He converted to SR in the ER without intervention. Patient is not on anticoagulation for a CHADS2VASC score of 0. There were no specific triggers that he could identify. He denies snoring or significant alcohol use.   On follow up today, patient reports that he has done very well since his last visit. He denies any heart racing or palpitations and has not had to use his PRN diltiazem. He had an echo which showed preserved EF with no valvular issues.   Today, he denies symptoms of palpitations, chest pain, shortness of breath, orthopnea, PND, lower extremity edema, dizziness, presyncope, syncope, snoring, daytime somnolence, bleeding, or neurologic sequela. The patient is tolerating medications without difficulties and is otherwise without complaint today.    Atrial Fibrillation Risk Factors:  he does not have symptoms or diagnosis of sleep apnea. he does not have a history of rheumatic fever. he does not have a history of alcohol use. The patient does have a history of early familial atrial fibrillation or other arrhythmias. Mother has afib.  he has a BMI of Body mass index is 22.04 kg/m.Marland Kitchen Filed Weights   10/01/19 1133  Weight: 69.7 kg    Family History  Problem Relation Age of Onset  . Diabetes Father   . Cancer Other        Breast and Prostate Cancer  . Alcohol abuse Neg Hx   . Heart disease Neg Hx   . Hyperlipidemia Neg Hx   . Hypertension Neg Hx   . Kidney disease Neg Hx   . Stroke Neg Hx   . Early death Neg Hx       Atrial Fibrillation Management history:  Previous antiarrhythmic drugs: none Previous cardioversions: none Previous ablations: none CHADS2VASC score: 0 Anticoagulation history: none   Past Medical History:  Diagnosis Date  . Crohn's disease of both small and large intestine with intestinal obstruction ([place]) 03/2012   treated in [place]  . GERD (gastroesophageal reflux disease)   . History of kidney stones    Past Surgical History:  Procedure Laterality Date  . CYSTOSCOPY WITH RETROGRADE PYELOGRAM, URETEROSCOPY AND STENT PLACEMENT Right 08/18/2017   Procedure: CYSTOSCOPY WITH RETROGRADE PYELOGRAM, URETEROSCOPY AND STENT PLACEMENT;  Surgeon: Alexis Frock, MD;  Location: [facility];  Service: Urology;  Laterality: Right;  45 MINS  . HERNIA REPAIR     incisional   . HOLMIUM LASER APPLICATION Right 6/81/1572   Procedure: HOLMIUM LASER APPLICATION;  Surgeon: Alexis Frock, MD;  [facility];  Service: Urology;  Laterality: Right;  . SMALL INTESTINE SURGERY  03/2012   190 cm of small intestine removed    Current Outpatient Medications  Medication Sig Dispense Refill  . Cholecalciferol (VITAMIN D PO) Take 50 Units by mouth daily.     . Cyanocobalamin (B-12) 3000 MCG SUBL Place 3,000 mcg under the tongue 3 (three) times a week.    . diltiazem (CARDIZEM) 30 MG tablet Take 1 tablet every 4 hours AS NEEDED for AFIB heart rate >100 45 tablet 1  . inFLIXimab (REMICADE) 100 MG injection Inject 300 mg at  0 wk, 2 wk, 6 wk, and 8 wk intervals  To be infused at [facility] for benadryl 50 mg and tylenol 500 mg prior to injection (Patient taking differently: Inject 300 mg as directed See admin instructions. Inject 300 mg at 0 wk, 2 wk, 6 wk, and 8 wk intervals  OK for benadryl 50 mg and tylenol 500 mg prior to injection) 1 each 1  . Probiotic Product (PROBIOTIC DAILY PO) Take 1 capsule by mouth every morning.      No current facility-administered medications for this  encounter.    Allergies  Allergen Reactions  . Azathioprine     swelling  . Acetaminophen Other (See Comments)    Headache discomfort  . Chlorhexidine Rash    Social History   Socioeconomic History  . Marital status: Married    Spouse name: Not on file  . Number of children: Not on file  . Years of education: Not on file  . Highest education level: Not on file  Occupational History  . Not on file  Tobacco Use  . Smoking status: Never Smoker  . Smokeless tobacco: Never Used  Vaping Use  . Vaping Use: Never used  Substance and Sexual Activity  . Alcohol use: Yes    Alcohol/week: 2.0 standard drinks    Types: 1 Cans of beer, 1 Standard drinks or equivalent per week    Comment: rarely   . Drug use: No  . Sexual activity: Not Currently  Other Topics Concern  . Not on file  Social History Narrative  . Not on file   Social Determinants of Health   Financial Resource Strain:   . Difficulty of Paying Living Expenses:   Food Insecurity:   . Worried About Charity fundraiser in the Last Year:   . Arboriculturist in the Last Year:   Transportation Needs:   . Film/video editor (Medical):   Marland Kitchen Lack of Transportation (Non-Medical):   Physical Activity:   . Days of Exercise per Week:   . Minutes of Exercise per Session:   Stress:   . Feeling of Stress :   Social Connections:   . Frequency of Communication with Friends and Family:   . Frequency of Social Gatherings with Friends and Family:   . Attends Religious Services:   . Active Member of Clubs or Organizations:   . Attends Archivist Meetings:   Marland Kitchen Marital Status:   Intimate Partner Violence:   . Fear of Current or Ex-Partner:   . Emotionally Abused:   Marland Kitchen Physically Abused:   . Sexually Abused:      ROS- All systems are reviewed and negative except as per the HPI above.  Physical Exam: Vitals:   10/01/19 1133  BP: 112/78  Pulse: 78  Weight: 69.7 kg  Height: 5' 10"  (1.778 m)    GEN- The  patient is well appearing, alert and oriented x 3 today.   HEENT-head normocephalic, atraumatic, sclera clear, conjunctiva pink, hearing intact, trachea midline. Lungs- Clear to ausculation bilaterally, normal work of breathing Heart- Regular rate and rhythm, no murmurs, rubs or gallops  GI- soft, NT, ND, + BS Extremities- no clubbing, cyanosis, or edema MS- no significant deformity or atrophy Skin- no rash or lesion Psych- euthymic mood, full affect Neuro- strength and sensation are intact   Wt Readings from Last 3 Encounters:  10/01/19 69.7 kg  09/05/19 69.4 kg  08/29/19 68 kg    EKG today  demonstrates SR HR 78, PR 154, QRS 88, QTc 403  Echo 09/11/19 demonstrated 1. Left ventricular ejection fraction, by estimation, is 60 to 65%. The  left ventricle has normal function. The left ventricle has no regional  wall motion abnormalities. Left ventricular diastolic parameters were  normal.  2. Right ventricular systolic function is normal. The right ventricular  size is normal. Tricuspid regurgitation signal is inadequate for assessing  PA pressure.  3. The mitral valve is normal in structure. No evidence of mitral valve  regurgitation. No evidence of mitral stenosis.  4. The aortic valve is tricuspid. Aortic valve regurgitation is not  visualized. Mild aortic valve sclerosis is present, with no evidence of  aortic valve stenosis.  5. The inferior vena cava is normal in size with greater than 50%  respiratory variability, suggesting right atrial pressure of 3 mmHg.   Epic records are reviewed at length today  CHA2DS2-VASc Score = 0  The patient's score is based upon: CHF History: 0 HTN History: 0 Age : 0 Diabetes History: 0 Stroke History: 0 Vascular Disease History: 0 Gender: 0      ASSESSMENT AND PLAN: 1. Paroxysmal Atrial Fibrillation (ICD10:  I48.0) The patient's CHA2DS2-VASc score is 0, indicating a 0.2% annual risk of stroke.   Patient appears to be  maintaining SR. Continue diltiazem 30 mg PRN q 4 hours for heart racing.   Follow up in the AF clinic in 6 months.    [facility] [address] [place], [place] [address] [phone] 10/01/2019 11:54 AM

## 2019-10-02 ENCOUNTER — Ambulatory Visit (HOSPITAL_COMMUNITY): Payer: BC Managed Care – PPO | Admitting: Physician Assistant

## 2019-10-02 DIAGNOSIS — R3121 Asymptomatic microscopic hematuria: Secondary | ICD-10-CM | POA: Diagnosis not present

## 2019-10-02 DIAGNOSIS — N201 Calculus of ureter: Secondary | ICD-10-CM | POA: Diagnosis not present

## 2019-10-02 LAB — URINALYSIS, ROUTINE W REFLEX MICROSCOPIC
Bacteria, UA: NONE SEEN
Bilirubin Urine: NEGATIVE
Glucose, UA: NEGATIVE mg/dL
Ketones, ur: NEGATIVE mg/dL
Leukocytes,Ua: NEGATIVE
Nitrite: NEGATIVE
Protein, ur: NEGATIVE mg/dL
RBC / HPF: >50 RBC/hpf — ABNORMAL HIGH (ref 0–5)
Specific Gravity, Urine: 1.014 (ref 1.005–1.030)
pH: 6 (ref 5.0–8.0)

## 2019-10-02 MED ORDER — OXYCODONE HCL 5 MG PO TABS
5.0000 mg | ORAL_TABLET | Freq: Once | ORAL | Status: AC
  Administered 2019-10-02: 5 mg via ORAL
  Filled 2019-10-02: qty 1

## 2019-10-02 NOTE — ED Notes (Signed)
Patient states he no longer wants to be seen tonight.

## 2019-10-03 ENCOUNTER — Other Ambulatory Visit: Payer: Self-pay | Admitting: Urology

## 2019-10-03 DIAGNOSIS — K5090 Crohn's disease, unspecified, without complications: Secondary | ICD-10-CM | POA: Diagnosis not present

## 2019-10-03 DIAGNOSIS — Z20822 Contact with and (suspected) exposure to covid-19: Secondary | ICD-10-CM | POA: Diagnosis not present

## 2019-10-04 ENCOUNTER — Ambulatory Visit: Admit: 2019-10-04 | Payer: BC Managed Care – PPO | Admitting: Urology

## 2019-10-04 SURGERY — CYSTOURETEROSCOPY, WITH RETROGRADE PYELOGRAM AND STENT INSERTION
Anesthesia: General | Laterality: Left

## 2019-10-18 DIAGNOSIS — N200 Calculus of kidney: Secondary | ICD-10-CM | POA: Diagnosis not present

## 2019-10-18 DIAGNOSIS — N210 Calculus in bladder: Secondary | ICD-10-CM | POA: Diagnosis not present

## 2019-10-18 DIAGNOSIS — N201 Calculus of ureter: Secondary | ICD-10-CM | POA: Diagnosis not present

## 2019-11-13 DIAGNOSIS — Z20822 Contact with and (suspected) exposure to covid-19: Secondary | ICD-10-CM | POA: Diagnosis not present

## 2019-11-28 DIAGNOSIS — K5090 Crohn's disease, unspecified, without complications: Secondary | ICD-10-CM | POA: Diagnosis not present

## 2019-12-03 DIAGNOSIS — N201 Calculus of ureter: Secondary | ICD-10-CM | POA: Diagnosis not present

## 2019-12-09 DIAGNOSIS — K5000 Crohn's disease of small intestine without complications: Secondary | ICD-10-CM | POA: Diagnosis not present

## 2020-01-01 ENCOUNTER — Other Ambulatory Visit: Payer: Self-pay

## 2020-01-01 ENCOUNTER — Ambulatory Visit (INDEPENDENT_AMBULATORY_CARE_PROVIDER_SITE_OTHER): Payer: BC Managed Care – PPO | Admitting: *Deleted

## 2020-01-01 DIAGNOSIS — Z23 Encounter for immunization: Secondary | ICD-10-CM | POA: Diagnosis not present

## 2020-01-29 DIAGNOSIS — K5090 Crohn's disease, unspecified, without complications: Secondary | ICD-10-CM | POA: Diagnosis not present

## 2020-01-29 DIAGNOSIS — Z79899 Other long term (current) drug therapy: Secondary | ICD-10-CM | POA: Diagnosis not present

## 2020-02-18 DIAGNOSIS — Z87442 Personal history of urinary calculi: Secondary | ICD-10-CM | POA: Diagnosis not present

## 2020-02-18 DIAGNOSIS — N201 Calculus of ureter: Secondary | ICD-10-CM | POA: Diagnosis not present

## 2020-02-21 DIAGNOSIS — Z20822 Contact with and (suspected) exposure to covid-19: Secondary | ICD-10-CM | POA: Diagnosis not present

## 2020-03-04 ENCOUNTER — Other Ambulatory Visit: Payer: Self-pay

## 2020-03-04 ENCOUNTER — Ambulatory Visit (HOSPITAL_COMMUNITY)
Admission: RE | Admit: 2020-03-04 | Discharge: 2020-03-04 | Disposition: A | Discharge status: Home / Self Care | Payer: BC Managed Care – PPO | Source: Ambulatory Visit | Attending: Physician Assistant | Admitting: Physician Assistant

## 2020-03-04 ENCOUNTER — Encounter (HOSPITAL_COMMUNITY): Payer: Self-pay | Admitting: Physician Assistant

## 2020-03-04 VITALS — BP 110/68 | HR 64 | Ht 70.0 in | Wt 152.8 lb

## 2020-03-04 DIAGNOSIS — I480 Paroxysmal atrial fibrillation: Secondary | ICD-10-CM | POA: Diagnosis not present

## 2020-03-04 NOTE — Progress Notes (Signed)
Primary Care Physician: Janith Lima, MD Primary Cardiologist: none Primary Electrophysiologist: none Referring Physician: [facility]   Jeffrey Reilly. is a 39 y.o. male with a history of Crohn disease and paroxysmal atrial fibrillation who presents for follow up in the [facility]. The patient was initially diagnosed with atrial fibrillation on 08/29/19 after presenting to the ED with symptoms of palpitations. He converted to SR in the ER without intervention. Patient is not on anticoagulation for a CHADS2VASC score of 0. There were no specific triggers that he could identify. He denies snoring or significant alcohol use.   On follow up today, patient reports he has done well since his last visit. He has only had 2-3 very brief palpitations which lasted only a few seconds. He has not taken his PRN CCB.   Today, he denies symptoms of chest pain, shortness of breath, orthopnea, PND, lower extremity edema, dizziness, presyncope, syncope, snoring, daytime somnolence, bleeding, or neurologic sequela. The patient is tolerating medications without difficulties and is otherwise without complaint today.    Atrial Fibrillation Risk Factors:  he does not have symptoms or diagnosis of sleep apnea. he does not have a history of rheumatic fever. he does not have a history of alcohol use. The patient does have a history of early familial atrial fibrillation or other arrhythmias. Mother has afib.  he has a BMI of Body mass index is 21.92 kg/m.Marland Kitchen Filed Weights   03/04/20 1438  Weight: 69.3 kg    Family History  Problem Relation Age of Onset  . Diabetes Father   . Cancer Other        Breast and Prostate Cancer  . Alcohol abuse Neg Hx   . Heart disease Neg Hx   . Hyperlipidemia Neg Hx   . Hypertension Neg Hx   . Kidney disease Neg Hx   . Stroke Neg Hx   . Early death Neg Hx      Atrial Fibrillation Management history:  Previous antiarrhythmic drugs:  none Previous cardioversions: none Previous ablations: none CHADS2VASC score: 0 Anticoagulation history: none   Past Medical History:  Diagnosis Date  . Crohn's disease of both small and large intestine with intestinal obstruction ([place]) 03/2012   treated in [place]  . GERD (gastroesophageal reflux disease)   . History of kidney stones    Past Surgical History:  Procedure Laterality Date  . CYSTOSCOPY WITH RETROGRADE PYELOGRAM, URETEROSCOPY AND STENT PLACEMENT Right 08/18/2017   Procedure: CYSTOSCOPY WITH RETROGRADE PYELOGRAM, URETEROSCOPY AND STENT PLACEMENT;  Surgeon: Alexis Frock, MD;  [facility]: [facility];  Service: Urology;  Laterality: Right;  45 MINS  . HERNIA REPAIR     incisional   . HOLMIUM LASER APPLICATION Right 9/53/2023   Procedure: HOLMIUM LASER APPLICATION;  Surgeon: Alexis Frock, MD;  [facility];  Service: Urology;  Laterality: Right;  . SMALL INTESTINE SURGERY  03/2012   190 cm of small intestine removed    Current Outpatient Medications  Medication Sig Dispense Refill  . Cholecalciferol (VITAMIN D PO) Take 50 Units by mouth daily.     . Cyanocobalamin (B-12) 3000 MCG SUBL Place 3,000 mcg under the tongue 3 (three) times a week.    . diltiazem (CARDIZEM) 30 MG tablet Take 1 tablet every 4 hours AS NEEDED for AFIB heart rate >100 45 tablet 1  . inFLIXimab (REMICADE) 100 MG injection Inject 300 mg at 0 wk, 2 wk, 6 wk, and 8 wk intervals  To  be infused at [facility] for benadryl 50 mg and tylenol 500 mg prior to injection (Patient taking differently: Inject 300 mg as directed See admin instructions. Inject 300 mg at 0 wk, 2 wk, 6 wk, and 8 wk intervals  OK for benadryl 50 mg and tylenol 500 mg prior to injection) 1 each 1  . Probiotic Product (PROBIOTIC DAILY PO) Take 1 capsule by mouth every morning.      No current facility-administered medications for this encounter.    Allergies  Allergen Reactions  . Azathioprine     swelling  .  Acetaminophen Other (See Comments)    Headache discomfort  . Chlorhexidine Rash    Social History   Socioeconomic History  . Marital status: Married    Spouse name: Not on file  . Number of children: Not on file  . Years of education: Not on file  . Highest education level: Not on file  Occupational History  . Not on file  Tobacco Use  . Smoking status: Never Smoker  . Smokeless tobacco: Never Used  Vaping Use  . Vaping Use: Never used  Substance and Sexual Activity  . Alcohol use: Yes    Alcohol/week: 2.0 standard drinks    Types: 1 Cans of beer, 1 Standard drinks or equivalent per week    Comment: rarely   . Drug use: No  . Sexual activity: Not Currently  Other Topics Concern  . Not on file  Social History Narrative  . Not on file   Social Determinants of Health   Financial Resource Strain: Not on file  Food Insecurity: Not on file  Transportation Needs: Not on file  Physical Activity: Not on file  Stress: Not on file  Social Connections: Not on file  Intimate Partner Violence: Not on file     ROS- All systems are reviewed and negative except as per the HPI above.  Physical Exam: Vitals:   03/04/20 1438  BP: 110/68  Pulse: 64  Weight: 69.3 kg  Height: 5' 10"  (1.778 m)    GEN- The patient is well appearing, alert and oriented x 3 today.   HEENT-head normocephalic, atraumatic, sclera clear, conjunctiva pink, hearing intact, trachea midline. Lungs- Clear to ausculation bilaterally, normal work of breathing Heart- Regular rate and rhythm, no murmurs, rubs or gallops  GI- soft, NT, ND, + BS Extremities- no clubbing, cyanosis, or edema MS- no significant deformity or atrophy Skin- no rash or lesion Psych- euthymic mood, full affect Neuro- strength and sensation are intact   Wt Readings from Last 3 Encounters:  03/04/20 69.3 kg  10/01/19 69.7 kg  09/05/19 69.4 kg    EKG today demonstrates SR HR 64, PR 142, QRS 84, QTc 379  Echo 09/11/19  demonstrated 1. Left ventricular ejection fraction, by estimation, is 60 to 65%. The  left ventricle has normal function. The left ventricle has no regional  wall motion abnormalities. Left ventricular diastolic parameters were  normal.  2. Right ventricular systolic function is normal. The right ventricular  size is normal. Tricuspid regurgitation signal is inadequate for assessing  PA pressure.  3. The mitral valve is normal in structure. No evidence of mitral valve  regurgitation. No evidence of mitral stenosis.  4. The aortic valve is tricuspid. Aortic valve regurgitation is not  visualized. Mild aortic valve sclerosis is present, with no evidence of  aortic valve stenosis.  5. The inferior vena cava is normal in size with greater than 50%  respiratory  variability, suggesting right atrial pressure of 3 mmHg.   Epic records are reviewed at length today  CHA2DS2-VASc Score = 0  The patient's score is based upon: CHF History: No HTN History: No Diabetes History: No Stroke History: No Vascular Disease History: No      ASSESSMENT AND PLAN: 1. Paroxysmal Atrial Fibrillation (ICD10:  I48.0) The patient's CHA2DS2-VASc score is 0, indicating a 0.2% annual risk of stroke.   Patient appears to be maintaining SR. Continue diltiazem 30 mg PRN q 4 hours for heart racing. No indication for anticoagulation at this time. Given his young age, would consider front line ablation if his afib becomes more persistent.    Follow up in the AF clinic in one year.    [facility] [address] [place], [place] [address] [phone] 03/04/2020 2:56 PM

## 2020-03-11 DIAGNOSIS — N200 Calculus of kidney: Secondary | ICD-10-CM | POA: Diagnosis not present

## 2020-03-30 DIAGNOSIS — K5090 Crohn's disease, unspecified, without complications: Secondary | ICD-10-CM | POA: Diagnosis not present

## 2020-04-01 ENCOUNTER — Ambulatory Visit (HOSPITAL_COMMUNITY): Payer: BC Managed Care – PPO | Admitting: Physician Assistant

## 2020-05-27 DIAGNOSIS — K5090 Crohn's disease, unspecified, without complications: Secondary | ICD-10-CM | POA: Diagnosis not present

## 2020-06-09 DIAGNOSIS — R82991 Hypocitraturia: Secondary | ICD-10-CM | POA: Diagnosis not present

## 2020-07-15 ENCOUNTER — Other Ambulatory Visit: Payer: Self-pay

## 2020-07-16 ENCOUNTER — Telehealth (HOSPITAL_COMMUNITY): Payer: Self-pay

## 2020-07-16 ENCOUNTER — Encounter: Payer: Self-pay | Admitting: Internal Medicine

## 2020-07-16 ENCOUNTER — Ambulatory Visit (INDEPENDENT_AMBULATORY_CARE_PROVIDER_SITE_OTHER): Payer: BC Managed Care – PPO | Admitting: Internal Medicine

## 2020-07-16 ENCOUNTER — Other Ambulatory Visit: Payer: Self-pay | Admitting: Internal Medicine

## 2020-07-16 VITALS — BP 122/70 | HR 73 | Temp 98.3°F | Resp 18 | Ht 70.0 in | Wt 194.2 lb

## 2020-07-16 DIAGNOSIS — R1310 Dysphagia, unspecified: Secondary | ICD-10-CM

## 2020-07-16 DIAGNOSIS — K50812 Crohn's disease of both small and large intestine with intestinal obstruction: Secondary | ICD-10-CM

## 2020-07-16 LAB — CBC
HCT: 50.7 % (ref 39.0–52.0)
Hemoglobin: 17 g/dL (ref 13.0–17.0)
MCHC: 33.5 g/dL (ref 30.0–36.0)
MCV: 89.6 fl (ref 78.0–100.0)
Platelets: 172 10*3/uL (ref 150.0–400.0)
RBC: 5.66 Mil/uL (ref 4.22–5.81)
RDW: 13 % (ref 11.5–15.5)
WBC: 8.4 10*3/uL (ref 4.0–10.5)

## 2020-07-16 LAB — COMPREHENSIVE METABOLIC PANEL
ALT: 22 U/L (ref 0–53)
AST: 21 U/L (ref 0–37)
Albumin: 4.6 g/dL (ref 3.5–5.2)
Alkaline Phosphatase: 47 U/L (ref 39–117)
BUN: 13 mg/dL (ref 6–23)
CO2: 32 mEq/L (ref 19–32)
Calcium: 9.7 mg/dL (ref 8.4–10.5)
Chloride: 102 mEq/L (ref 96–112)
Creatinine, Ser: 1.29 mg/dL (ref 0.40–1.50)
GFR: 68.79 mL/min (ref >60.00)
Glucose, Bld: 125 mg/dL — ABNORMAL HIGH (ref 70–99)
Potassium: 4.5 mEq/L (ref 3.5–5.1)
Sodium: 140 mEq/L (ref 135–145)
Total Bilirubin: 1.4 mg/dL — ABNORMAL HIGH (ref 0.2–1.2)
Total Protein: 7.3 g/dL (ref 6.0–8.3)

## 2020-07-16 LAB — LIPASE: Lipase: 33 U/L (ref 11.0–59.0)

## 2020-07-16 NOTE — Telephone Encounter (Signed)
4.28 - Sent secure chat to [facility] requesting to place correct order. Once order is placed, patient will be contacted to schedule. HW

## 2020-07-16 NOTE — Progress Notes (Signed)
   Subjective:   Patient ID: Jeffrey Reilly., male    DOB: Jun 09, 1980, 40 y.o.   MRN: [id number]  HPI The patient is a 40 YO man coming in for concerns about dysphagia/sternal pain. He has complicated GI history including crohn's disease s/p resection small intestine. He sees [facility] and called them about his symptoms yesterday. They recommended barium swallow but did not order this for him as he told them he was seeing [place]. This has been going on for about 2 months. He is presenting now as it is worsening. He gets remicade and next week is next infusion. He is having pain in the lower sternum which typically happens very far away from meals. He denies problems with swallowing during meals. He has had strictures in the past and this feels a little similar but not identical. The location is different. 2 nights ago he had a very severe episode which prompted him to make appointment. He did wake up around 2am with pain in the lower sternum did radiate slightly to the right upper quadrant. He tried to massage the area, change positions which did not help. He was struggling with this severe pain until 8 am the following morning. Denies blood in stool, increase in BM, change in consistency of BM. Denies change in diet or exercise. Denies fevers or chills. Denies significant change in weight. Does have history of kidney stones but this does not feel similar.   Review of Systems  Constitutional: Negative.   HENT: Positive for trouble swallowing.   Eyes: Negative.   Respiratory: Negative for cough, chest tightness and shortness of breath.   Cardiovascular: Negative for chest pain, palpitations and leg swelling.  Gastrointestinal: Positive for abdominal pain and nausea. Negative for abdominal distention, constipation, diarrhea and vomiting.  Musculoskeletal: Negative.   Skin: Negative.   Neurological: Negative.   Psychiatric/Behavioral: Negative.     Objective:  Physical Exam Constitutional:       Appearance: He is well-developed.  HENT:     Head: Normocephalic and atraumatic.  Cardiovascular:     Rate and Rhythm: Normal rate and regular rhythm.  Pulmonary:     Effort: Pulmonary effort is normal. No respiratory distress.     Breath sounds: Normal breath sounds. No wheezing or rales.  Abdominal:     General: Bowel sounds are normal. There is no distension.     Palpations: Abdomen is soft.     Tenderness: There is no abdominal tenderness. There is no rebound.  Musculoskeletal:     Cervical back: Normal range of motion.  Skin:    General: Skin is warm and dry.  Neurological:     Mental Status: He is alert and oriented to person, place, and time.     Coordination: Coordination normal.     Vitals:   07/16/20 0836  BP: 122/70  Pulse: 73  Resp: 18  Temp: 98.3 F (36.8 C)  TempSrc: Oral  SpO2: 97%  Weight: 194 lb 3.2 oz (88.1 kg)  Height: 5' 10"  (1.778 m)   EKG: Rate 60, axis normal, interval normal, sinus, chronic st or t wave changes V2-4, no significant change compared to prior 12/21  This visit occurred during the SARS-CoV-2 public health emergency.  Safety protocols were in place, including screening questions prior to the visit, additional usage of staff PPE, and extensive cleaning of exam room while observing appropriate contact time as indicated for disinfecting solutions.   Assessment & Plan:

## 2020-07-16 NOTE — Patient Instructions (Signed)
The EKG of the heart looks normal. We will check the labs and make the final decision from there.

## 2020-07-17 DIAGNOSIS — R1310 Dysphagia, unspecified: Secondary | ICD-10-CM | POA: Insufficient documentation

## 2020-07-17 NOTE — Assessment & Plan Note (Signed)
New problem, having pain in the sternum region. This could be esophageal blockage, hiatal hernia, or stricture from crohn's. Checking CBC, CMP, lipase to start and then getting swallow barium. If needed CT abdomen/pelvis if etiology not revealed.

## 2020-07-17 NOTE — Assessment & Plan Note (Addendum)
It is unclear to me if this represents flare with potential stricture. His symptoms are moderate and worsening. Due for remicaid next week. His GI provider did recommend to start with barium swallow to assess his symptoms of lump under sternum. EKG done today to rule out cardiac etiology which is unchanged from prior. Checking CBC, CMP, lipase. If swallowing assessment is unrevealing we will order CT abdomen/pelvis with contrast.

## 2020-07-20 ENCOUNTER — Encounter: Payer: Self-pay | Admitting: Internal Medicine

## 2020-07-20 ENCOUNTER — Telehealth: Payer: Self-pay | Admitting: Internal Medicine

## 2020-07-20 ENCOUNTER — Other Ambulatory Visit (HOSPITAL_COMMUNITY): Payer: Self-pay

## 2020-07-20 DIAGNOSIS — R1310 Dysphagia, unspecified: Secondary | ICD-10-CM

## 2020-07-20 NOTE — Telephone Encounter (Signed)
Pt has been informed.

## 2020-07-20 NOTE — Telephone Encounter (Signed)
We spent quite a long time discussing this at his visit and it was also recommended by his GI specialist. His crohn's can cause narrowing and with his symptoms this is the best first test to look at this.

## 2020-07-20 NOTE — Telephone Encounter (Signed)
I called the pt regarding his appointment for the barium swallow test and was wondering why he was having it done.   I just assumed it was because he was have swallowing issues and he stated he didn't and mentioned the other symptoms.   He would like to know what's all involved with the test to see if it's what he needs for his issues.   Can you let [place] now why you ordered this and have her call him to explain?  Thank you Cecille Rubin

## 2020-07-22 DIAGNOSIS — K5090 Crohn's disease, unspecified, without complications: Secondary | ICD-10-CM | POA: Diagnosis not present

## 2020-07-28 ENCOUNTER — Ambulatory Visit (HOSPITAL_COMMUNITY): Payer: BC Managed Care – PPO

## 2020-07-28 ENCOUNTER — Other Ambulatory Visit: Payer: Self-pay

## 2020-07-28 ENCOUNTER — Ambulatory Visit (HOSPITAL_COMMUNITY)
Admission: RE | Admit: 2020-07-28 | Discharge: 2020-07-28 | Disposition: A | Discharge status: Home / Self Care | Payer: BC Managed Care – PPO | Source: Ambulatory Visit | Attending: Internal Medicine | Admitting: Internal Medicine

## 2020-07-28 ENCOUNTER — Other Ambulatory Visit: Payer: Self-pay | Admitting: Internal Medicine

## 2020-07-28 DIAGNOSIS — R1310 Dysphagia, unspecified: Secondary | ICD-10-CM

## 2020-07-28 NOTE — Telephone Encounter (Signed)
Suanne Marker from [facility] testing called  Regarding the test that was ordered for the Modified Barium swallow. Wants to verify that the patient doesn't need a barium swallow test instead. Patient is scheduled for an appointment today.   Please advise and call back at [phone].

## 2020-07-28 NOTE — Telephone Encounter (Signed)
I have handled this with the SLP department directly.

## 2020-08-03 ENCOUNTER — Telehealth: Payer: Self-pay | Admitting: Internal Medicine

## 2020-08-03 ENCOUNTER — Telehealth (HOSPITAL_COMMUNITY): Payer: Self-pay

## 2020-08-03 NOTE — Telephone Encounter (Signed)
Patient states he was scheduled for some swallow test last week but they told him it was the wrong test so he was trying to figure out what needed to be done. Dr. Sharlet Salina was the ordering provider

## 2020-08-03 NOTE — Telephone Encounter (Signed)
Patient called following up on getting scheduled for a swallow test. Dr.Crawford placed order for a OP MBS. Patient was scheduled on 5.10. Mickel Baas, ST stated patient did not need a OP MBS but needed an Esophogram. Mickel Baas sent Dr.Crawford a secure chat to follow up. No order in to be scheduled. Patient following up with Dr.Crawford. Centralized scheduling will be contacted once order is placed.

## 2020-08-05 NOTE — Telephone Encounter (Signed)
I had chatted with the speech folks and put in order number they told me to put in. This was put in on 07/28/20 which was when I was contacted. If radiology is unable to schedule it may be best for him to follow up with PCP or his GI provider.

## 2020-08-05 NOTE — Telephone Encounter (Signed)
See below

## 2020-08-06 NOTE — Telephone Encounter (Signed)
Unable to get in contact with the patient. LDVM asking him to give our office a call. Office number was provided. I have sent a mychart message as well.

## 2020-09-18 DIAGNOSIS — K5090 Crohn's disease, unspecified, without complications: Secondary | ICD-10-CM | POA: Diagnosis not present

## 2020-09-22 ENCOUNTER — Telehealth: Payer: Self-pay | Admitting: Internal Medicine

## 2020-09-22 NOTE — Telephone Encounter (Signed)
   Patient called and said that he is still having abdominal pain. He seen Dr. Sharlet Salina in April and said that it has continued. He is wanting to see PCP this week. Please advise

## 2020-09-23 ENCOUNTER — Other Ambulatory Visit: Payer: Self-pay

## 2020-09-23 ENCOUNTER — Ambulatory Visit (INDEPENDENT_AMBULATORY_CARE_PROVIDER_SITE_OTHER): Payer: BC Managed Care – PPO | Admitting: Internal Medicine

## 2020-09-23 ENCOUNTER — Encounter: Payer: Self-pay | Admitting: Internal Medicine

## 2020-09-23 VITALS — BP 108/74 | HR 77 | Temp 98.3°F | Resp 16 | Ht 70.0 in | Wt 151.0 lb

## 2020-09-23 DIAGNOSIS — R1011 Right upper quadrant pain: Secondary | ICD-10-CM | POA: Diagnosis not present

## 2020-09-23 NOTE — Patient Instructions (Signed)
Abdominal Pain, Adult Pain in the abdomen (abdominal pain) can be caused by many things. Often, abdominal pain is not serious and it gets better with no treatment or by being treated at home. However, sometimesabdominal pain is serious. Your health care provider will ask questions about your medical history and doa physical exam to try to determine the cause of your abdominal pain. Follow these instructions at home: Medicines Take over-the-counter and prescription medicines only as told by your health care provider. Do not take a laxative unless told by your health care provider. General instructions  Watch your condition for any changes. Drink enough fluid to keep your urine pale yellow. Keep all follow-up visits as told by your health care provider. This is important.  Contact a health care provider if: Your abdominal pain changes or gets worse. You are not hungry or you lose weight without trying. You are constipated or have diarrhea for more than 2-3 days. You have pain when you urinate or have a bowel movement. Your abdominal pain wakes you up at night. Your pain gets worse with meals, after eating, or with certain foods. You are vomiting and cannot keep anything down. You have a fever. You have blood in your urine. Get help right away if: Your pain does not go away as soon as your health care provider told you to expect. You cannot stop vomiting. Your pain is only in areas of the abdomen, such as the right side or the left lower portion of the abdomen. Pain on the right side could be caused by appendicitis. You have bloody or black stools, or stools that look like tar. You have severe pain, cramping, or bloating in your abdomen. You have signs of dehydration, such as: Dark urine, very little urine, or no urine. Cracked lips. Dry mouth. Sunken eyes. Sleepiness. Weakness. You have trouble breathing or chest pain. Summary Often, abdominal pain is not serious and it gets  better with no treatment or by being treated at home. However, sometimes abdominal pain is serious. Watch your condition for any changes. Take over-the-counter and prescription medicines only as told by your health care provider. Contact a health care provider if your abdominal pain changes or gets worse. Get help right away if you have severe pain, cramping, or bloating in your abdomen. This information is not intended to replace advice given to you by your health care provider. Make sure you discuss any questions you have with your healthcare provider. Document Revised: 04/26/2019 Document Reviewed: 07/16/2018 Elsevier Patient Education  [place].

## 2020-09-23 NOTE — Telephone Encounter (Signed)
Pt scheduled today at 11.20am

## 2020-09-23 NOTE — Progress Notes (Signed)
Subjective:  Patient ID: Jeffrey Friend., male    DOB: 22-Aug-1980  Age: 40 y.o. MRN: [id number]  CC: Abdominal Pain  This visit occurred during the SARS-CoV-2 public health emergency.  Safety protocols were in place, including screening questions prior to the visit, additional usage of staff PPE, and extensive cleaning of exam room while observing appropriate contact time as indicated for disinfecting solutions.    HPI Jeffrey Reilly. presents for f/up -  He complains of a 3 to 41-monthhistory of pain in his epigastric region and right upper quadrant.  He describes it as an achy sensation that is exacerbated by eating.  He tells me he has discussed this [facility] another primary care physician [facility]  He tells me that an upper GI series was ordered but has not been done yet.  He denies odynophagia, dysphagia, heartburn, indigestion, nausea, vomiting, loss of appetite, weight loss, bright red blood per rectum, or melena.  He had lab work done elsewhere 1 week prior to this appointment.  Outpatient Medications Prior to Visit  Medication Sig Dispense Refill   diltiazem (CARDIZEM) 30 MG tablet Take 1 tablet every 4 hours AS NEEDED for AFIB heart rate >100 45 tablet 1   inFLIXimab (REMICADE) 100 MG injection Inject 300 mg at 0 wk, 2 wk, 6 wk, and 8 wk intervals  [facility] and tylenol 500 mg prior to injection (Patient taking differently: Inject 300 mg as directed See admin instructions. Inject 300 mg at 0 wk, 2 wk, 6 wk, and 8 wk intervals  OK for benadryl 50 mg and tylenol 500 mg prior to injection) 1 each 1   POTASSIUM CITRATE PO Take by mouth.     Probiotic Product (PROBIOTIC DAILY PO) Take 1 capsule by mouth every morning.      Cholecalciferol (VITAMIN D PO) Take 50 Units by mouth daily.      Cyanocobalamin (B-12) 3000 MCG SUBL Place 3,000 mcg under the tongue 3 (three) times a week.     No facility-administered medications  prior to visit.    ROS Review of Systems  Constitutional:  Negative for appetite change, diaphoresis, fatigue and unexpected weight change.  HENT: Negative.    Eyes: Negative.   Respiratory:  Negative for cough, chest tightness, shortness of breath and wheezing.   Cardiovascular:  Negative for chest pain, palpitations and leg swelling.  Gastrointestinal:  Positive for abdominal pain. Negative for blood in stool, constipation, diarrhea, nausea and vomiting.  Endocrine: Negative.   Genitourinary:  Negative for difficulty urinating, dysuria and hematuria.  Musculoskeletal: Negative.  Negative for arthralgias and myalgias.  Skin: Negative.  Negative for color change and pallor.  Allergic/Immunologic: Negative.   Neurological: Negative.   Hematological:  Negative for adenopathy. Does not bruise/bleed easily.   Objective:  BP 108/74 (BP Location: Left Arm, Patient Position: Sitting, Cuff Size: Large)   Pulse 77   Temp 98.3 F (36.8 C) (Oral)   Resp 16   Ht 5' 10"  (1.778 m)   Wt 151 lb (68.5 kg)   SpO2 96%   BMI 21.67 kg/m   BP Readings from Last 3 Encounters:  09/23/20 108/74  07/16/20 122/70  03/04/20 110/68    Wt Readings from Last 3 Encounters:  09/23/20 151 lb (68.5 kg)  07/16/20 194 lb 3.2 oz (88.1 kg)  03/04/20 152 lb 12.8 oz (69.3 kg)    Physical  Exam Vitals reviewed.  Constitutional:      Appearance: He is not ill-appearing.  HENT:     Nose: Nose normal.     Mouth/Throat:     Mouth: Mucous membranes are moist.  Eyes:     General: No scleral icterus.    Conjunctiva/sclera: Conjunctivae normal.  Cardiovascular:     Rate and Rhythm: Normal rate and regular rhythm.     Heart sounds: No murmur heard. Pulmonary:     Effort: Pulmonary effort is normal.     Breath sounds: No stridor. No wheezing, rhonchi or rales.  Abdominal:     General: Abdomen is flat. Bowel sounds are normal. There is no distension.     Palpations: Abdomen is soft. There is no hepatomegaly,  splenomegaly or mass.     Tenderness: There is no abdominal tenderness. There is no guarding or rebound.  Musculoskeletal:        General: Normal range of motion.     Cervical back: Neck supple.     Right lower leg: No edema.     Left lower leg: No edema.  Lymphadenopathy:     Cervical: No cervical adenopathy.  Skin:    General: Skin is warm and dry.  Neurological:     General: No focal deficit present.     Mental Status: He is alert.  Psychiatric:        Mood and Affect: Mood normal.        Behavior: Behavior normal.    Lab Results  Component Value Date   WBC 8.4 07/16/2020   HGB 17.0 07/16/2020   HCT 50.7 07/16/2020   PLT 172.0 07/16/2020   GLUCOSE 125 (H) 07/16/2020   ALT 22 07/16/2020   AST 21 07/16/2020   NA 140 07/16/2020   K 4.5 07/16/2020   CL 102 07/16/2020   CREATININE 1.29 07/16/2020   BUN 13 07/16/2020   CO2 32 07/16/2020   TSH 3.143 08/29/2019   PSA 0.98 05/22/2017    No results found.  Assessment & Plan:   Jeffrey Reilly was seen today for abdominal pain.  Diagnoses and all orders for this visit:  Colicky RUQ abdominal pain- It sounds like his recent labs have been normal.  I recommended that he undergo an ultrasound to screen for cholelithiasis.  If the ultrasound is negative for gallstones then I will consider doing a HIDA scan. -     US Abdomen Limited RUQ (LIVER/GB); Future  I have discontinued Jeffrey Graff Jr.'s B-12 and Cholecalciferol (VITAMIN D PO). I am also having him maintain his Probiotic Product (PROBIOTIC DAILY PO), inFLIXimab, diltiazem, and POTASSIUM CITRATE PO.  No orders of the defined types were placed in this encounter.    Follow-up: Return in about 3 weeks (around 10/14/2020).  Jeffrey Calico, MD

## 2020-09-29 ENCOUNTER — Ambulatory Visit
Admission: RE | Admit: 2020-09-29 | Discharge: 2020-09-29 | Disposition: A | Discharge status: Home / Self Care | Payer: BC Managed Care – PPO | Source: Ambulatory Visit | Attending: Internal Medicine | Admitting: Internal Medicine

## 2020-09-29 DIAGNOSIS — R1011 Right upper quadrant pain: Secondary | ICD-10-CM | POA: Diagnosis not present

## 2020-10-01 ENCOUNTER — Other Ambulatory Visit: Payer: Self-pay | Admitting: Internal Medicine

## 2020-10-01 DIAGNOSIS — K8020 Calculus of gallbladder without cholecystitis without obstruction: Secondary | ICD-10-CM

## 2020-10-08 ENCOUNTER — Other Ambulatory Visit: Payer: BC Managed Care – PPO

## 2020-10-13 DIAGNOSIS — N200 Calculus of kidney: Secondary | ICD-10-CM | POA: Diagnosis not present

## 2020-11-04 DIAGNOSIS — K8020 Calculus of gallbladder without cholecystitis without obstruction: Secondary | ICD-10-CM | POA: Diagnosis not present

## 2020-11-09 ENCOUNTER — Other Ambulatory Visit: Payer: Self-pay | Admitting: General Surgery

## 2020-11-09 DIAGNOSIS — K8020 Calculus of gallbladder without cholecystitis without obstruction: Secondary | ICD-10-CM

## 2020-11-17 ENCOUNTER — Ambulatory Visit
Admission: RE | Admit: 2020-11-17 | Discharge: 2020-11-17 | Disposition: A | Discharge status: Home / Self Care | Payer: BC Managed Care – PPO | Source: Ambulatory Visit | Attending: General Surgery | Admitting: General Surgery

## 2020-11-17 DIAGNOSIS — K8020 Calculus of gallbladder without cholecystitis without obstruction: Secondary | ICD-10-CM | POA: Diagnosis not present

## 2020-11-18 DIAGNOSIS — K5090 Crohn's disease, unspecified, without complications: Secondary | ICD-10-CM | POA: Diagnosis not present

## 2020-12-09 DIAGNOSIS — Z23 Encounter for immunization: Secondary | ICD-10-CM | POA: Diagnosis not present

## 2020-12-09 DIAGNOSIS — K912 Postsurgical malabsorption, not elsewhere classified: Secondary | ICD-10-CM | POA: Diagnosis not present

## 2020-12-09 DIAGNOSIS — K5000 Crohn's disease of small intestine without complications: Secondary | ICD-10-CM | POA: Diagnosis not present

## 2020-12-09 DIAGNOSIS — Z79899 Other long term (current) drug therapy: Secondary | ICD-10-CM | POA: Diagnosis not present

## 2021-01-11 DIAGNOSIS — K5000 Crohn's disease of small intestine without complications: Secondary | ICD-10-CM | POA: Diagnosis not present

## 2021-01-13 DIAGNOSIS — K5090 Crohn's disease, unspecified, without complications: Secondary | ICD-10-CM | POA: Diagnosis not present

## 2021-01-14 DIAGNOSIS — K5000 Crohn's disease of small intestine without complications: Secondary | ICD-10-CM | POA: Diagnosis not present

## 2021-01-14 DIAGNOSIS — R1011 Right upper quadrant pain: Secondary | ICD-10-CM | POA: Diagnosis not present

## 2021-01-20 DIAGNOSIS — K8020 Calculus of gallbladder without cholecystitis without obstruction: Secondary | ICD-10-CM | POA: Diagnosis not present

## 2021-01-22 ENCOUNTER — Other Ambulatory Visit: Payer: Self-pay | Admitting: General Surgery

## 2021-01-22 DIAGNOSIS — Z8719 Personal history of other diseases of the digestive system: Secondary | ICD-10-CM

## 2021-02-17 DIAGNOSIS — R82992 Hyperoxaluria: Secondary | ICD-10-CM | POA: Diagnosis not present

## 2021-02-17 DIAGNOSIS — Z87442 Personal history of urinary calculi: Secondary | ICD-10-CM | POA: Diagnosis not present

## 2021-02-17 DIAGNOSIS — R82991 Hypocitraturia: Secondary | ICD-10-CM | POA: Diagnosis not present

## 2021-03-03 ENCOUNTER — Other Ambulatory Visit: Payer: Self-pay | Admitting: General Surgery

## 2021-03-03 DIAGNOSIS — K8000 Calculus of gallbladder with acute cholecystitis without obstruction: Secondary | ICD-10-CM

## 2021-03-04 ENCOUNTER — Ambulatory Visit (HOSPITAL_COMMUNITY)
Admission: RE | Admit: 2021-03-04 | Discharge: 2021-03-04 | Disposition: A | Discharge status: Home / Self Care | Payer: BC Managed Care – PPO | Source: Ambulatory Visit | Attending: Physician Assistant | Admitting: Physician Assistant

## 2021-03-04 ENCOUNTER — Other Ambulatory Visit: Payer: Self-pay

## 2021-03-04 ENCOUNTER — Encounter (HOSPITAL_COMMUNITY): Payer: Self-pay | Admitting: Physician Assistant

## 2021-03-04 VITALS — BP 122/82 | HR 76 | Ht 70.0 in | Wt 154.6 lb

## 2021-03-04 DIAGNOSIS — I480 Paroxysmal atrial fibrillation: Secondary | ICD-10-CM

## 2021-03-04 DIAGNOSIS — K5080 Crohn's disease of both small and large intestine without complications: Secondary | ICD-10-CM | POA: Insufficient documentation

## 2021-03-04 MED ORDER — DILTIAZEM HCL 30 MG PO TABS: ORAL_TABLET | ORAL | 1 refills | Status: DC

## 2021-03-04 NOTE — Progress Notes (Signed)
Primary Care Physician: Janith Lima, MD Primary Cardiologist: none Primary Electrophysiologist: none Referring Physician: [facility]   Jeffrey Reilly. is a 40 y.o. male with a history of Crohn disease and paroxysmal atrial fibrillation who presents for follow up in the [facility]. The patient was initially diagnosed with atrial fibrillation on 08/29/19 after presenting to the ED with symptoms of palpitations. He converted to SR in the ER without intervention. Patient is not on anticoagulation for a CHADS2VASC score of 0. There were no specific triggers that he could identify. He denies snoring or significant alcohol use.   On follow up today, patient reports that he has done very well since his last visit. He had two very brief palpitations lasting maybe 1-2 seconds. No other symptoms. Of note, he is seeing GI/surgery for gallstones.   Today, he denies symptoms of palpitations, chest pain, shortness of breath, orthopnea, PND, lower extremity edema, dizziness, presyncope, syncope, snoring, daytime somnolence, bleeding, or neurologic sequela. The patient is tolerating medications without difficulties and is otherwise without complaint today.    Atrial Fibrillation Risk Factors:  he does not have symptoms or diagnosis of sleep apnea. he does not have a history of rheumatic fever. he does not have a history of alcohol use. The patient does have a history of early familial atrial fibrillation or other arrhythmias. Mother has afib.  he has a BMI of Body mass index is 22.18 kg/m.Marland Kitchen Filed Weights   03/04/21 0940  Weight: 70.1 kg     Family History  Problem Relation Age of Onset   Diabetes Father    Cancer Other        Breast and Prostate Cancer   Alcohol abuse Neg Hx    Heart disease Neg Hx    Hyperlipidemia Neg Hx    Hypertension Neg Hx    Kidney disease Neg Hx    Stroke Neg Hx    Early death Neg Hx      Atrial Fibrillation Management  history:  Previous antiarrhythmic drugs: none Previous cardioversions: none Previous ablations: none CHADS2VASC score: 0 Anticoagulation history: none   Past Medical History:  Diagnosis Date   Crohn's disease of both small and large intestine with intestinal obstruction ([place]) 03/2012   treated in [place]   GERD (gastroesophageal reflux disease)    History of kidney stones    Past Surgical History:  Procedure Laterality Date   CYSTOSCOPY WITH RETROGRADE PYELOGRAM, URETEROSCOPY AND STENT PLACEMENT Right 08/18/2017   Procedure: CYSTOSCOPY WITH RETROGRADE PYELOGRAM, URETEROSCOPY AND STENT PLACEMENT;  Surgeon: Alexis Frock, MD;  Location: [facility];  Service: Urology;  Laterality: Right;  [place]     incisional    HOLMIUM LASER APPLICATION Right 2/70/3500   Procedure: HOLMIUM LASER APPLICATION;  Surgeon: Alexis Frock, MD;  Location: [facility];  Service: Urology;  Laterality: Right;   SMALL INTESTINE SURGERY  03/2012   190 cm of small intestine removed    Current Outpatient Medications  Medication Sig Dispense Refill   Cyanocobalamin (B-12) 100 MCG TABS Take by mouth.     Ergocalciferol 10 MCG (400 UNIT) TABS Take by mouth.     inFLIXimab (REMICADE) 100 MG injection Inject 300 mg at 0 wk, 2 wk, 6 wk, and 8 wk intervals  To be infused at [place] for benadryl 50 mg and tylenol 500 mg prior to injection (Patient taking differently: Inject 300 mg as directed See admin  instructions. Inject 300 mg at 0 wk, 2 wk, 6 wk, and 8 wk intervals  OK for benadryl 50 mg and tylenol 500 mg prior to injection) 1 each 1   potassium citrate (UROCIT-K) 5 MEQ (540 MG) SR tablet Take 15 mEq by mouth 2 (two) times daily.     Probiotic Product (PROBIOTIC DAILY PO) Take 1 capsule by mouth every morning.      ursodiol (ACTIGALL) 300 MG capsule Take 300 mg by mouth 2 (two) times daily.     diltiazem (CARDIZEM) 30 MG tablet Take 1 tablet every 4 hours AS NEEDED for AFIB heart  rate >100 45 tablet 1   No current facility-administered medications for this encounter.    Allergies  Allergen Reactions   Azathioprine     swelling   Acetaminophen Other (See Comments)    Headache discomfort   Chlorhexidine Rash    Social History   Socioeconomic History   Marital status: Married    Spouse name: Not on file   Number of children: Not on file   Years of education: Not on file   Highest education level: Not on file  Occupational History   Not on file  Tobacco Use   Smoking status: Never   Smokeless tobacco: Never  Vaping Use   Vaping Use: Never used  Substance and Sexual Activity   Alcohol use: Yes    Alcohol/week: 2.0 standard drinks    Types: 1 Cans of beer, 1 Standard drinks or equivalent per week    Comment: rarely    Drug use: No   Sexual activity: Not Currently  Other Topics Concern   Not on file  Social History Narrative   Not on file   Social Determinants of Health   Financial Resource Strain: Not on file  Food Insecurity: Not on file  Transportation Needs: Not on file  Physical Activity: Not on file  Stress: Not on file  Social Connections: Not on file  Intimate Partner Violence: Not on file     ROS- All systems are reviewed and negative except as per the HPI above.  Physical Exam: Vitals:   03/04/21 0940  BP: 122/82  Pulse: 76  Weight: 70.1 kg  Height: 5' 10"  (1.778 m)    GEN- The patient is a well appearing male, alert and oriented x 3 today.   HEENT-head normocephalic, atraumatic, sclera clear, conjunctiva pink, hearing intact, trachea midline. Lungs- Clear to ausculation bilaterally, normal work of breathing Heart- Regular rate and rhythm, no murmurs, rubs or gallops  GI- soft, NT, ND, + BS Extremities- no clubbing, cyanosis, or edema MS- no significant deformity or atrophy Skin- no rash or lesion Psych- euthymic mood, full affect Neuro- strength and sensation are intact   Wt Readings from Last 3 Encounters:   03/04/21 70.1 kg  09/23/20 68.5 kg  07/16/20 88.1 kg    EKG today demonstrates  SR Vent. rate 76 BPM PR interval 138 ms QRS duration 84 ms QT/QTcB 360/405 ms  Echo 09/11/19 demonstrated  1. Left ventricular ejection fraction, by estimation, is 60 to 65%. The  left ventricle has normal function. The left ventricle has no regional  wall motion abnormalities. Left ventricular diastolic parameters were  normal.   2. Right ventricular systolic function is normal. The right ventricular  size is normal. Tricuspid regurgitation signal is inadequate for assessing PA pressure.   3. The mitral valve is normal in structure. No evidence of mitral valve  regurgitation. No evidence of mitral stenosis.  4. The aortic valve is tricuspid. Aortic valve regurgitation is not  visualized. Mild aortic valve sclerosis is present, with no evidence of  aortic valve stenosis.   5. The inferior vena cava is normal in size with greater than 50%  respiratory variability, suggesting right atrial pressure of 3 mmHg.   Epic records are reviewed at length today  CHA2DS2-VASc Score = 0  The patient's score is based upon: CHF History: 0 HTN History: 0 Diabetes History: 0 Stroke History: 0 Vascular Disease History: 0 Age Score: 0 Gender Score: 0       ASSESSMENT AND PLAN: 1. Paroxysmal Atrial Fibrillation (ICD10:  I48.0) The patient's CHA2DS2-VASc score is 0, indicating a 0.2% annual risk of stroke.   Patient appears to be maintaining SR.  Continue diltiazem 30 mg PRN q 4 hours for heart racing. No indication for anticoagulation at this time. Given his young age, would consider front line ablation if his afib becomes more persistent.    Follow up in the AF clinic in one year.    [facility] [address] [place], [place] [address] [phone] 03/04/2021 10:32 AM

## 2021-03-10 DIAGNOSIS — K5090 Crohn's disease, unspecified, without complications: Secondary | ICD-10-CM | POA: Diagnosis not present

## 2021-03-17 ENCOUNTER — Other Ambulatory Visit: Payer: BC Managed Care – PPO

## 2021-03-18 ENCOUNTER — Ambulatory Visit
Admission: RE | Admit: 2021-03-18 | Discharge: 2021-03-18 | Disposition: A | Discharge status: Home / Self Care | Payer: BC Managed Care – PPO | Source: Ambulatory Visit | Attending: General Surgery | Admitting: General Surgery

## 2021-03-18 DIAGNOSIS — K8020 Calculus of gallbladder without cholecystitis without obstruction: Secondary | ICD-10-CM | POA: Diagnosis not present

## 2021-03-18 DIAGNOSIS — K8000 Calculus of gallbladder with acute cholecystitis without obstruction: Secondary | ICD-10-CM

## 2021-03-19 DIAGNOSIS — Z87442 Personal history of urinary calculi: Secondary | ICD-10-CM | POA: Diagnosis not present

## 2021-03-24 DIAGNOSIS — K8020 Calculus of gallbladder without cholecystitis without obstruction: Secondary | ICD-10-CM | POA: Diagnosis not present

## 2021-03-24 DIAGNOSIS — K5000 Crohn's disease of small intestine without complications: Secondary | ICD-10-CM | POA: Diagnosis not present

## 2021-05-05 DIAGNOSIS — K5090 Crohn's disease, unspecified, without complications: Secondary | ICD-10-CM | POA: Diagnosis not present

## 2021-06-07 IMAGING — DX DG CHEST 1V PORT
1 series · 1 of 1 positions shown · non-contrast
Comparison: June 18, 2015

CLINICAL DATA: Cardiac arrhythmia

EXAM:
PORTABLE CHEST 1 VIEW

[chest]
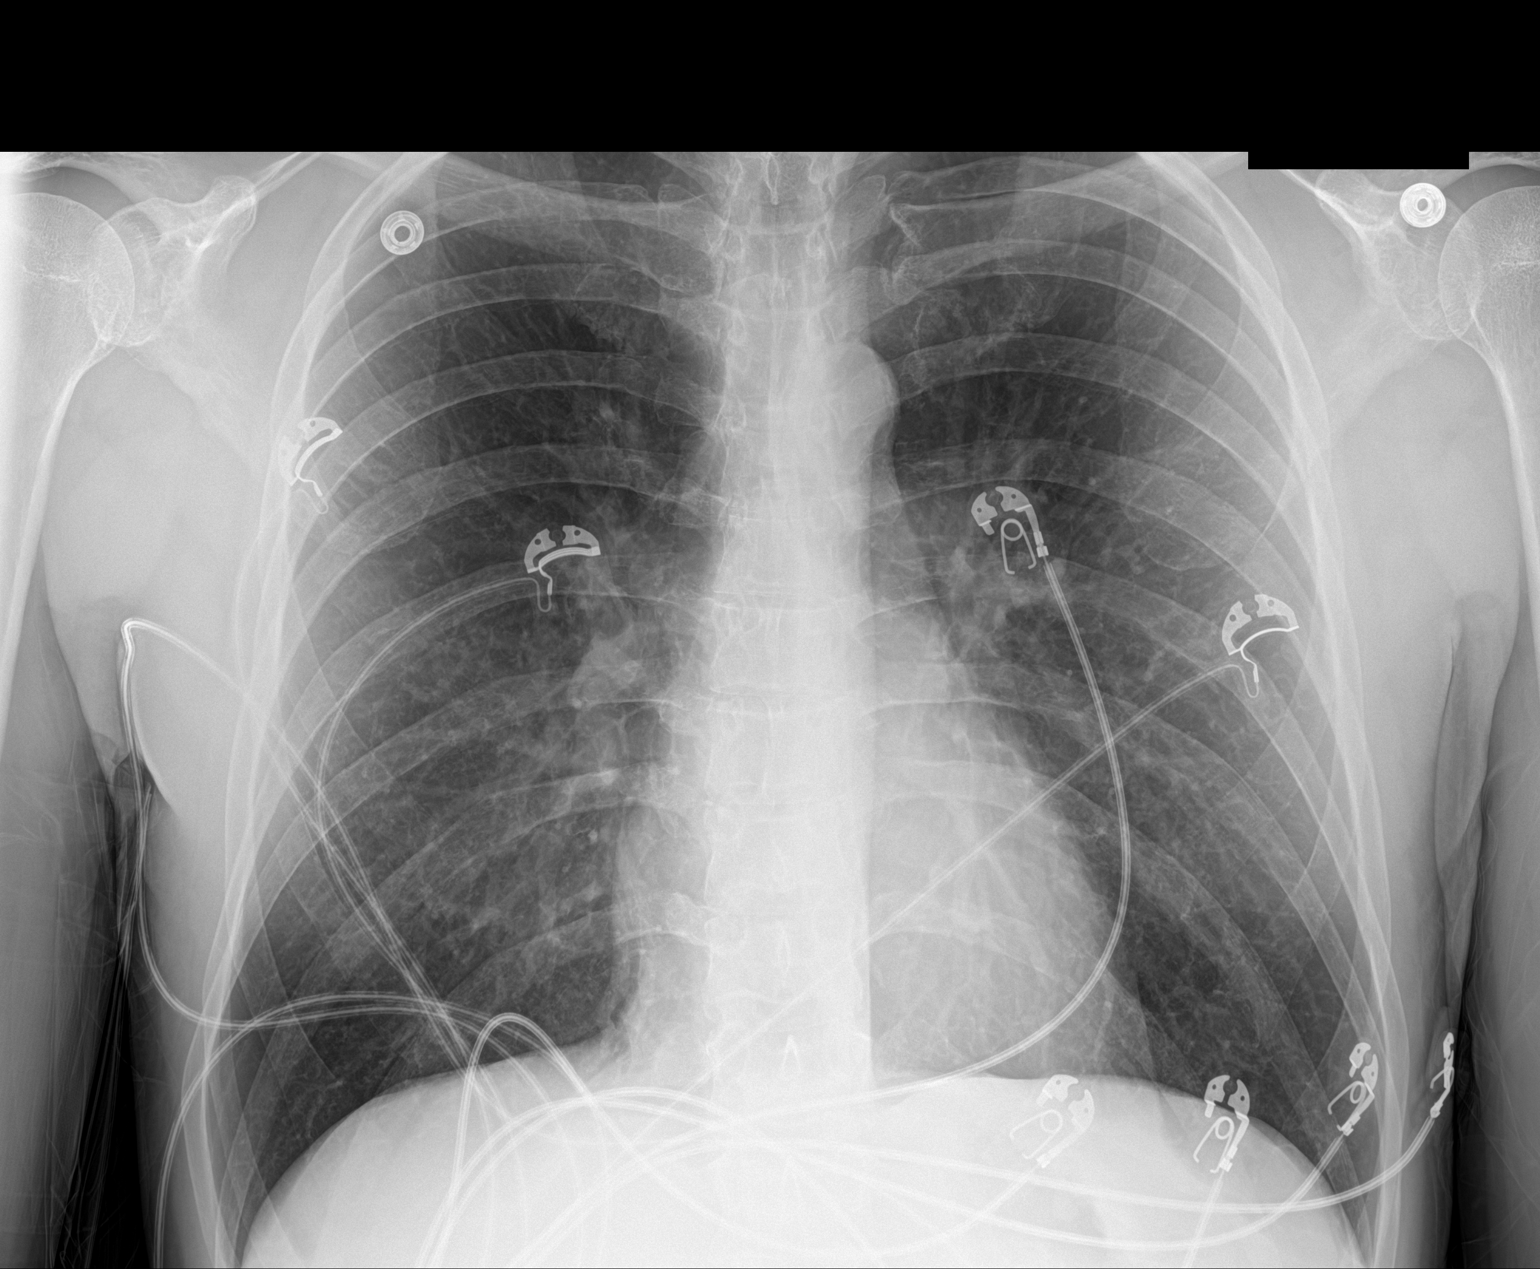

[1 of 1 positions shown; findings below may reference images not displayed]

FINDINGS: There is no edema or airspace opacity. Heart size and pulmonary
vascularity are normal. No adenopathy. No pneumothorax. No bone
lesions.
IMPRESSION: Lungs clear.  Cardiac silhouette within normal limits.

## 2021-06-29 DIAGNOSIS — Z79899 Other long term (current) drug therapy: Secondary | ICD-10-CM | POA: Diagnosis not present

## 2021-06-29 DIAGNOSIS — K5090 Crohn's disease, unspecified, without complications: Secondary | ICD-10-CM | POA: Diagnosis not present

## 2021-06-29 DIAGNOSIS — R21 Rash and other nonspecific skin eruption: Secondary | ICD-10-CM | POA: Diagnosis not present

## 2021-06-30 DIAGNOSIS — K5090 Crohn's disease, unspecified, without complications: Secondary | ICD-10-CM | POA: Diagnosis not present

## 2021-07-09 DIAGNOSIS — L218 Other seborrheic dermatitis: Secondary | ICD-10-CM | POA: Diagnosis not present

## 2021-08-07 DIAGNOSIS — N200 Calculus of kidney: Secondary | ICD-10-CM | POA: Diagnosis not present

## 2021-08-18 DIAGNOSIS — R82992 Hyperoxaluria: Secondary | ICD-10-CM | POA: Diagnosis not present

## 2021-08-18 DIAGNOSIS — R82991 Hypocitraturia: Secondary | ICD-10-CM | POA: Diagnosis not present

## 2021-08-31 ENCOUNTER — Encounter: Payer: Self-pay | Admitting: Internal Medicine

## 2021-08-31 ENCOUNTER — Ambulatory Visit (INDEPENDENT_AMBULATORY_CARE_PROVIDER_SITE_OTHER): Payer: BC Managed Care – PPO | Admitting: Internal Medicine

## 2021-08-31 VITALS — BP 126/82 | HR 68 | Temp 98.0°F | Ht 70.0 in | Wt 155.0 lb

## 2021-08-31 DIAGNOSIS — Z0001 Encounter for general adult medical examination with abnormal findings: Secondary | ICD-10-CM | POA: Insufficient documentation

## 2021-08-31 DIAGNOSIS — E538 Deficiency of other specified B group vitamins: Secondary | ICD-10-CM

## 2021-08-31 DIAGNOSIS — I480 Paroxysmal atrial fibrillation: Secondary | ICD-10-CM

## 2021-08-31 DIAGNOSIS — Z1159 Encounter for screening for other viral diseases: Secondary | ICD-10-CM | POA: Diagnosis not present

## 2021-08-31 DIAGNOSIS — R739 Hyperglycemia, unspecified: Secondary | ICD-10-CM | POA: Insufficient documentation

## 2021-08-31 LAB — CBC WITH DIFFERENTIAL/PLATELET
Basophils Absolute: 0 10*3/uL (ref 0.0–0.1)
Basophils Relative: 0.5 % (ref 0.0–3.0)
Eosinophils Absolute: 0.2 10*3/uL (ref 0.0–0.7)
Eosinophils Relative: 2 % (ref 0.0–5.0)
HCT: 49.6 % (ref 39.0–52.0)
Hemoglobin: 16.4 g/dL (ref 13.0–17.0)
Lymphocytes Relative: 37.1 % (ref 12.0–46.0)
Lymphs Abs: 3.2 10*3/uL (ref 0.7–4.0)
MCHC: 33 g/dL (ref 30.0–36.0)
MCV: 90.8 fl (ref 78.0–100.0)
Monocytes Absolute: 0.7 10*3/uL (ref 0.1–1.0)
Monocytes Relative: 8.1 % (ref 3.0–12.0)
Neutro Abs: 4.5 10*3/uL (ref 1.4–7.7)
Neutrophils Relative %: 52.3 % (ref 43.0–77.0)
Platelets: 156 10*3/uL (ref 150.0–400.0)
RBC: 5.46 Mil/uL (ref 4.22–5.81)
RDW: 13.2 % (ref 11.5–15.5)
WBC: 8.5 10*3/uL (ref 4.0–10.5)

## 2021-08-31 LAB — LIPID PANEL
Cholesterol: 91 mg/dL (ref 0–200)
HDL: 44.6 mg/dL (ref >39.00)
LDL Cholesterol: 10 mg/dL (ref 0–99)
NonHDL: 46.15
Total CHOL/HDL Ratio: 2
Triglycerides: 181 mg/dL — ABNORMAL HIGH (ref 0.0–149.0)
VLDL: 36.2 mg/dL (ref 0.0–40.0)

## 2021-08-31 LAB — BASIC METABOLIC PANEL
BUN: 14 mg/dL (ref 6–23)
CO2: 29 mEq/L (ref 19–32)
Calcium: 9.7 mg/dL (ref 8.4–10.5)
Chloride: 102 mEq/L (ref 96–112)
Creatinine, Ser: 1.26 mg/dL (ref 0.40–1.50)
GFR: 70.2 mL/min (ref >60.00)
Glucose, Bld: 93 mg/dL (ref 70–99)
Potassium: 4.5 mEq/L (ref 3.5–5.1)
Sodium: 140 mEq/L (ref 135–145)

## 2021-08-31 LAB — HEMOGLOBIN A1C: Hgb A1c MFr Bld: 5.2 % (ref 4.6–6.5)

## 2021-08-31 LAB — VITAMIN B12: Vitamin B-12: 926 pg/mL — ABNORMAL HIGH (ref 211–911)

## 2021-08-31 LAB — TSH: TSH: 2.18 u[IU]/mL (ref 0.35–5.50)

## 2021-08-31 LAB — FOLATE: Folate: 18.8 ng/mL (ref >5.9)

## 2021-08-31 NOTE — Progress Notes (Unsigned)
Subjective:  Patient ID: Jeffrey Friend., male    DOB: [birth year]  Age: 41 y.o. MRN: [id number]  CC: No chief complaint on file.   HPI Jeffrey Friend. presents for ***  Outpatient Medications Prior to Visit  Medication Sig Dispense Refill   Cyanocobalamin (B-12) 100 MCG TABS Take by mouth.     diltiazem (CARDIZEM) 30 MG tablet Take 1 tablet every 4 hours AS NEEDED for AFIB heart rate >100 45 tablet 1   Ergocalciferol 10 MCG (400 UNIT) TABS Take by mouth.     inFLIXimab (REMICADE) 100 MG injection Inject 300 mg at 0 wk, 2 wk, 6 wk, and 8 wk intervals  To be infused at [facility] for benadryl 50 mg and tylenol 500 mg prior to injection (Patient taking differently: Inject 300 mg as directed See admin instructions. Inject 300 mg at 0 wk, 2 wk, 6 wk, and 8 wk intervals  OK for benadryl 50 mg and tylenol 500 mg prior to injection) 1 each 1   potassium citrate (UROCIT-K) 5 MEQ (540 MG) SR tablet Take 15 mEq by mouth 2 (two) times daily.     Probiotic Product (PROBIOTIC DAILY PO) Take 1 capsule by mouth every morning.      ursodiol (ACTIGALL) 300 MG capsule Take 300 mg by mouth 2 (two) times daily.     No facility-administered medications prior to visit.    ROS Review of Systems  Objective:  BP 126/82 (BP Location: Right Arm, Patient Position: Sitting, Cuff Size: Large)   Pulse 68   Temp 98 F (36.7 C) (Oral)   Ht 5' 10"  (1.778 m)   Wt 155 lb (70.3 kg)   SpO2 95%   BMI 22.24 kg/m   BP Readings from Last 3 Encounters:  08/31/21 126/82  03/04/21 122/82  09/23/20 108/74    Wt Readings from Last 3 Encounters:  08/31/21 155 lb (70.3 kg)  03/04/21 154 lb 9.6 oz (70.1 kg)  09/23/20 151 lb (68.5 kg)    Physical Exam  Lab Results  Component Value Date   WBC 8.4 07/16/2020   HGB 17.0 07/16/2020   HCT 50.7 07/16/2020   PLT 172.0 07/16/2020   GLUCOSE 125 (H) 07/16/2020   ALT 22 07/16/2020   AST 21 07/16/2020   NA 140 07/16/2020   K 4.5 07/16/2020   CL  102 07/16/2020   CREATININE 1.29 07/16/2020   BUN 13 07/16/2020   CO2 32 07/16/2020   TSH 3.143 08/29/2019   PSA 0.98 05/22/2017    US ABDOMEN LIMITED RUQ (LIVER/GB)  Result Date: 03/18/2021 CLINICAL DATA:  Gallstones EXAM: ULTRASOUND ABDOMEN LIMITED RIGHT UPPER QUADRANT COMPARISON:  11/09/2020 FINDINGS: Gallbladder: Small stone near the gallbladder neck. Normal wall thickness. Negative sonographic Murphy. Common bile duct: Diameter: 3.4 mm Liver: Slightly echogenic. Focal hyper and hypoechoic regions within the right and left hepatic lobes. Portal vein is patent on color Doppler imaging with normal direction of blood flow towards the liver. Other: None. IMPRESSION: 1. Small gallstone without sonographic features to suggest an acute cholecystitis 2. Heterogeneous slightly echogenic liver with areas of increased and decreased echogenicity within the right and left hepatic lobes, suspect this is related to heterogeneous fat infiltration but could be confirmed with MRI. Electronically Signed   By: Donavan Foil M.D.   On: 03/18/2021 21:34    Assessment & Plan:   There are no diagnoses linked to this encounter. I have discontinued Jeffrey Graff Jr.'s ursodiol.  I am also having him maintain his Probiotic Product (PROBIOTIC DAILY PO), inFLIXimab, B-12, Ergocalciferol, potassium citrate, and diltiazem.  No orders of the defined types were placed in this encounter.    Follow-up: No follow-ups on file.  Scarlette Calico, MD

## 2021-08-31 NOTE — Patient Instructions (Signed)
Health Maintenance, Male Adopting a healthy lifestyle and getting preventive care are important in promoting health and wellness. Ask your health care provider about: The right schedule for you to have regular tests and exams. Things you can do on your own to prevent diseases and keep yourself healthy. What should I know about diet, weight, and exercise? Eat a healthy diet  Eat a diet that includes plenty of vegetables, fruits, low-fat dairy products, and lean protein. Do not eat a lot of foods that are high in solid fats, added sugars, or sodium. Maintain a healthy weight Body mass index (BMI) is a measurement that can be used to identify possible weight problems. It estimates body fat based on height and weight. Your health care provider can help determine your BMI and help you achieve or maintain a healthy weight. Get regular exercise Get regular exercise. This is one of the most important things you can do for your health. Most adults should: Exercise for at least 150 minutes each week. The exercise should increase your heart rate and make you sweat (moderate-intensity exercise). Do strengthening exercises at least twice a week. This is in addition to the moderate-intensity exercise. Spend less time sitting. Even light physical activity can be beneficial. Watch cholesterol and blood lipids Have your blood tested for lipids and cholesterol at 43 years of age, then have this test every 5 years. You may need to have your cholesterol levels checked more often if: Your lipid or cholesterol levels are high. You are older than 43 years of age. You are at high risk for heart disease. What should I know about cancer screening? Many types of cancers can be detected early and may often be prevented. Depending on your health history and family history, you may need to have cancer screening at various ages. This may include screening for: Colorectal cancer. Prostate cancer. Skin cancer. Lung  cancer. What should I know about heart disease, diabetes, and high blood pressure? Blood pressure and heart disease High blood pressure causes heart disease and increases the risk of stroke. This is more likely to develop in people who have high blood pressure readings or are overweight. Talk with your health care provider about your target blood pressure readings. Have your blood pressure checked: Every 3-5 years if you are 18-39 years of age. Every year if you are 40 years old or older. If you are between the ages of 65 and 75 and are a current or former smoker, ask your health care provider if you should have a one-time screening for abdominal aortic aneurysm (AAA). Diabetes Have regular diabetes screenings. This checks your fasting blood sugar level. Have the screening done: Once every three years after age 45 if you are at a normal weight and have a low risk for diabetes. More often and at a younger age if you are overweight or have a high risk for diabetes. What should I know about preventing infection? Hepatitis B If you have a higher risk for hepatitis B, you should be screened for this virus. Talk with your health care provider to find out if you are at risk for hepatitis B infection. Hepatitis C Blood testing is recommended for: Everyone born from 1945 through 1965. Anyone with known risk factors for hepatitis C. Sexually transmitted infections (STIs) You should be screened each year for STIs, including gonorrhea and chlamydia, if: You are sexually active and are younger than 43 years of age. You are older than 43 years of age and your   health care provider tells you that you are at risk for this type of infection. Your sexual activity has changed since you were last screened, and you are at increased risk for chlamydia or gonorrhea. Ask your health care provider if you are at risk. Ask your health care provider about whether you are at high risk for HIV. Your health care provider  may recommend a prescription medicine to help prevent HIV infection. If you choose to take medicine to prevent HIV, you should first get tested for HIV. You should then be tested every 3 months for as long as you are taking the medicine. Follow these instructions at home: Alcohol use Do not drink alcohol if your health care provider tells you not to drink. If you drink alcohol: Limit how much you have to 0-2 drinks a day. Know how much alcohol is in your drink. In the [place] one drink equals one 12 oz bottle of beer (355 mL), one 5 oz glass of wine (148 mL), or one 1 oz glass of hard liquor (44 mL). Lifestyle Do not use any products that contain nicotine or tobacco. These products include cigarettes, chewing tobacco, and vaping devices, such as e-cigarettes. If you need help quitting, ask your health care provider. Do not use street drugs. Do not share needles. Ask your health care provider for help if you need support or information about quitting drugs. General instructions Schedule regular health, dental, and eye exams. Stay current with your vaccines. Tell your health care provider if: You often feel depressed. You have ever been abused or do not feel safe at home. Summary Adopting a healthy lifestyle and getting preventive care are important in promoting health and wellness. Follow your health care provider's instructions about healthy diet, exercising, and getting tested or screened for diseases. Follow your health care provider's instructions on monitoring your cholesterol and blood pressure. This information is not intended to replace advice given to you by your health care provider. Make sure you discuss any questions you have with your health care provider. Document Revised: 07/27/2020 Document Reviewed: 07/27/2020 Elsevier Patient Education  2023 [employer] Inc[employer]  

## 2021-09-01 LAB — HEPATITIS C ANTIBODY
Hepatitis C Ab: NONREACTIVE
SIGNAL TO CUT-OFF: 0.13 (ref <1.00)

## 2021-09-07 DIAGNOSIS — K5090 Crohn's disease, unspecified, without complications: Secondary | ICD-10-CM | POA: Diagnosis not present

## 2021-09-08 DIAGNOSIS — K5090 Crohn's disease, unspecified, without complications: Secondary | ICD-10-CM | POA: Diagnosis not present

## 2021-09-15 DIAGNOSIS — L02223 Furuncle of chest wall: Secondary | ICD-10-CM | POA: Diagnosis not present

## 2021-09-15 DIAGNOSIS — L814 Other melanin hyperpigmentation: Secondary | ICD-10-CM | POA: Diagnosis not present

## 2021-09-15 DIAGNOSIS — D225 Melanocytic nevi of trunk: Secondary | ICD-10-CM | POA: Diagnosis not present

## 2021-11-02 DIAGNOSIS — K5090 Crohn's disease, unspecified, without complications: Secondary | ICD-10-CM | POA: Diagnosis not present

## 2021-11-03 DIAGNOSIS — K5090 Crohn's disease, unspecified, without complications: Secondary | ICD-10-CM | POA: Diagnosis not present

## 2021-11-05 ENCOUNTER — Ambulatory Visit (INDEPENDENT_AMBULATORY_CARE_PROVIDER_SITE_OTHER): Payer: BC Managed Care – PPO | Admitting: Nurse Practitioner

## 2021-11-05 ENCOUNTER — Encounter: Payer: Self-pay | Admitting: Nurse Practitioner

## 2021-11-05 VITALS — HR 94

## 2021-11-05 DIAGNOSIS — U071 COVID-19: Secondary | ICD-10-CM

## 2021-11-05 DIAGNOSIS — R059 Cough, unspecified: Secondary | ICD-10-CM

## 2021-11-05 MED ORDER — DOXYCYCLINE HYCLATE 100 MG PO TABS: 100.0000 mg | ORAL_TABLET | Freq: Two times a day (BID) | ORAL | 0 refills | Status: DC

## 2021-11-05 NOTE — Addendum Note (Signed)
Addended by: Jeralyn Ruths E on: 11/05/2021 10:27 AM   Modules accepted: Orders

## 2021-11-05 NOTE — Assessment & Plan Note (Addendum)
Etiology unclear, recommend patient take another at home COVID-19 test which she is agreeable to.  He was told to send me his results via MyChart, if positive we will treat with molnupiravir (patient counseled on possible side effects), if negative will consider treatment with doxycycline as this may be evidence of viral infection that has turned bacterial as symptoms initially started a few weeks ago.  Offered prescription cough suppressant, but patient reported he does not feel this is necessary at this time.  Recommend if he needs cough suppression to use Mucinex DM over-the-counter.  Patient encouraged to proceed to the hospital if symptoms significantly worsen such as high fever or worsening shortness of breath.  Patient reports his understanding.  He was also encouraged to call the office if symptoms seem to persist for in person evaluation.

## 2021-11-05 NOTE — Progress Notes (Signed)
Established Patient Office Visit  An audio-only tele-health visit was completed today for this patient. I connected with  Jeffrey Reilly. on 11/05/21 utilizing audio-only technology and verified that I am speaking with the correct person using two identifiers. The patient was located at their home, and I was located at home during the encounter. I discussed the limitations of evaluation and management by telemedicine. The patient expressed understanding and agreed to proceed.     Subjective   Patient ID: Jeffrey Reilly., male    DOB: November 04, 1980  Age: 41 y.o. MRN: [id number]  Chief Complaint  Patient presents with   Cough    This patient arrives today for virtual evaluation.  Reports that symptoms initially started July 28 after he came back from a trip.  However after a few days symptoms seem to be getting better.  Approximately 2 days ago patient underwent his avsola infusion which he is prescribed for his Crohn's disease.  Then yesterday symptoms seem to return and were slightly different compared to when they initiated.  He reports a dry cough that seems deeper in his chest and rhinorrhea.  Denies any fever, is experiencing fatigue and malaise.  He also has noticed some shortness of breath.  He reports that he did take a COVID-19 at home test shortly returning home from his trip when his symptoms initiated which was negative.  He has not repeated this since symptoms returned yesterday.  Per chart review he has had 3 COVID-19 vaccines.    Review of Systems  Constitutional:  Positive for chills and malaise/fatigue. Negative for fever.  HENT:  Positive for congestion (rhinorrhea).   Respiratory:  Positive for cough and shortness of breath. Negative for sputum production.   Cardiovascular:  Negative for chest pain and palpitations.  Gastrointestinal:  Negative for abdominal pain, diarrhea and nausea.  Neurological:  Positive for headaches.      Objective:     Pulse 94   SpO2 96%  BP  Readings from Last 3 Encounters:  08/31/21 126/82  03/04/21 122/82  09/23/20 108/74   Wt Readings from Last 3 Encounters:  08/31/21 155 lb (70.3 kg)  03/04/21 154 lb 9.6 oz (70.1 kg)  09/23/20 151 lb (68.5 kg)      Physical Exam Comprehensive physical exam not completed today as office visit was conducted remotely.  Patient sounded fairly well over the phone, he did cough a few times.  He did not sound to be in any acute distress, he was able to speak without having to stop to breathe indicating no significant respiratory distress..  Patient was alert and oriented, and appeared to have appropriate judgment.   No results found for any visits on 11/05/21.    The ASCVD Risk score (Arnett DK, et al., 2019) failed to calculate for the following reasons:   The valid total cholesterol range is 130 to 320 mg/dL    Assessment & Plan:   Problem List Items Addressed This Visit       Other   Cough - Primary    Etiology unclear, recommend patient take another at home COVID-19 test which she is agreeable to.  He was told to send me his results via MyChart, if positive we will treat with molnupiravir (patient counseled on possible side effects), if negative will consider treatment with doxycycline as this may be evidence of viral infection that has turned bacterial as symptoms initially started a few weeks ago.  Offered prescription cough suppressant, but patient  reported he does not feel this is necessary at this time.  Recommend if he needs cough suppression to use Mucinex DM over-the-counter.  Patient encouraged to proceed to the hospital if symptoms significantly worsen such as high fever or worsening shortness of breath.  Patient reports his understanding.  He was also encouraged to call the office if symptoms seem to persist for in person evaluation.       Return if symptoms worsen or fail to improve.  Total time spent the telephone today was 18 minutes and 44 seconds.   Ailene Ards,  NP

## 2021-11-05 NOTE — Progress Notes (Addendum)
Update: patient sent picture of covid test which was negative. Will prescribe doxycycline. Patient educated on how to take the medication and provided information on possible side effects.

## 2021-11-09 ENCOUNTER — Other Ambulatory Visit: Payer: Self-pay | Admitting: Internal Medicine

## 2021-11-09 DIAGNOSIS — U071 COVID-19: Secondary | ICD-10-CM | POA: Insufficient documentation

## 2021-11-09 MED ORDER — NIRMATRELVIR/RITONAVIR (PAXLOVID)TABLET: 3.0000 | ORAL_TABLET | Freq: Two times a day (BID) | ORAL | 0 refills | Status: AC

## 2021-11-09 NOTE — Telephone Encounter (Signed)
Pt is requesting a call from nurse or Dr. Ronnald Ramp. He stated that Jeralyn Ruths advised him to reach out to Dr. Ronnald Ramp to consider starting an antiviral medication. Pt states his symptoms started on 11/04/21 and tested positive for covid on 11/08/21.

## 2021-11-11 NOTE — Telephone Encounter (Signed)
Please see the MyChart message reply(ies) for my assessment and plan.  The patient gave consent for this Medical Advice Message and is aware that it may result in a bill to their insurance company as well as the possibility that this may result in a co-payment or deductible. They are an established patient, but are not seeking medical advice exclusively about a problem treated during an in person or video visit in the last 7 days. I did not recommend an in person or video visit within 7 days of my reply.  I spent a total of 7 minutes cumulative time within 7 days through MyChart messaging Scarlette Calico, MD

## 2021-11-16 ENCOUNTER — Ambulatory Visit: Payer: Self-pay | Admitting: Licensed Clinical Social Worker

## 2021-11-16 NOTE — Patient Outreach (Signed)
  Care Coordination   Initial Visit Note   11/16/2021 Name: Jeffrey Reilly. MRN: [id number] DOB: 09/02/1980  Jeffrey Reilly. is a 41 y.o. year old male who sees Janith Lima, MD for primary care. I spoke with  [name] by phone today.  What matters to the patients health and wellness today? Client said he was stable and did not feel that he needed program services at this time.     Goals Addressed             This Visit's Progress    Patient said he was stable at present and did not feel that he needed program support at this time.       Care Coordination Interventions:  Active listening / Reflection utilized  Emotional Support Provided Informed client about Care Coordination program support Discussed client support with PCP Client said he was stable now and did not feel at this time that he needed Care Coordination program support. He was appreciative of receiving information about program         SDOH assessments and interventions completed:  No     Care Coordination Interventions Activated:  No  Care Coordination Interventions:  No, not indicated   Follow up plan: No further intervention required.   Encounter Outcome:  Pt. Visit Completed

## 2021-11-16 NOTE — Patient Instructions (Signed)
Visit Information  Thank you for taking time to visit with me today. Please don't hesitate to contact me if I can be of assistance to you before our next scheduled telephone appointment.  Following are the goals we discussed today:   No further intervention needed at present  Please call the care guide team at [phone] if you need to cancel or reschedule your appointment.   If you are experiencing a Mental Health or [facility] or need someone to talk to, please go to [facility] [place] [phone])   Following is a copy of your full plan of care:   Care Coordination Interventions:  Active listening / Reflection utilized  Emotional Support Provided Informed client about Care Coordination program support Discussed client support with PCP Client said he was stable now and did not feel at this time that he needed Care Coordination program support. He was appreciative of receiving information about program  Mr. Reihl was given information about Care Management services by the embedded care coordination team including:  Care Management services include personalized support from designated clinical staff supervised by his physician, including individualized plan of care and coordination with other care providers 24/7 contact phone numbers for assistance for urgent and routine care needs. The patient may stop CCM services at any time (effective at the end of the month) by phone call to the office staff.  Patient agreed to services and verbal consent obtained.   Norva Riffle.Ariyon Mittleman MSW, [place] [facility] representative [facility] Care Management [phone]

## 2021-12-20 DIAGNOSIS — K5090 Crohn's disease, unspecified, without complications: Secondary | ICD-10-CM | POA: Diagnosis not present

## 2021-12-29 DIAGNOSIS — K5090 Crohn's disease, unspecified, without complications: Secondary | ICD-10-CM | POA: Diagnosis not present

## 2022-01-04 ENCOUNTER — Encounter: Payer: Self-pay | Admitting: Internal Medicine

## 2022-01-05 ENCOUNTER — Other Ambulatory Visit: Payer: Self-pay | Admitting: Internal Medicine

## 2022-01-05 DIAGNOSIS — K50812 Crohn's disease of both small and large intestine with intestinal obstruction: Secondary | ICD-10-CM

## 2022-01-12 ENCOUNTER — Ambulatory Visit (INDEPENDENT_AMBULATORY_CARE_PROVIDER_SITE_OTHER): Payer: BC Managed Care – PPO | Admitting: Internal Medicine

## 2022-01-12 ENCOUNTER — Encounter: Payer: Self-pay | Admitting: Internal Medicine

## 2022-01-12 VITALS — BP 114/72 | HR 64 | Temp 98.1°F | Ht 70.0 in | Wt 155.0 lb

## 2022-01-12 DIAGNOSIS — K50812 Crohn's disease of both small and large intestine with intestinal obstruction: Secondary | ICD-10-CM | POA: Diagnosis not present

## 2022-01-12 NOTE — Progress Notes (Signed)
Subjective:  Patient ID: Jeffrey Friend., male    DOB: [birth year]  Age: 41 y.o. MRN: [id number]  CC: Follow-up   HPI Jeffrey Reilly. presents for f/up -  He comes in today asking for a referral to [facility] to consider long-term treatment options and issues regarding short gut syndrome and Crohn's disease.  He has rare diarrhea that is well controlled with Imodium.  He denies abdominal pain, nausea, vomiting, loss of appetite, weight loss, or bright red blood per rectum.  He has frequent labs done elsewhere.  Outpatient Medications Prior to Visit  Medication Sig Dispense Refill   Cyanocobalamin (B-12) 100 MCG TABS Take by mouth.     diltiazem (CARDIZEM) 30 MG tablet Take 1 tablet every 4 hours AS NEEDED for AFIB heart rate >100 45 tablet 1   Ergocalciferol 10 MCG (400 UNIT) TABS Take by mouth.     inFLIXimab-axxq (AVSOLA) 100 MG injection Inject into the vein.     potassium citrate (UROCIT-K) 5 MEQ (540 MG) SR tablet Take 15 mEq by mouth 2 (two) times daily.     Probiotic Product (PROBIOTIC DAILY PO) Take 1 capsule by mouth every morning.      doxycycline (VIBRA-TABS) 100 MG tablet Take 1 tablet (100 mg total) by mouth 2 (two) times daily. 20 tablet 0   inFLIXimab (REMICADE) 100 MG injection Inject 300 mg at 0 wk, 2 wk, 6 wk, and 8 wk intervals  To be infused at [facility] for benadryl 50 mg and tylenol 500 mg prior to injection (Patient taking differently: Inject 300 mg as directed See admin instructions. Inject 300 mg at 0 wk, 2 wk, 6 wk, and 8 wk intervals  OK for benadryl 50 mg and tylenol 500 mg prior to injection) 1 each 1   No facility-administered medications prior to visit.    ROS Review of Systems  Constitutional:  Negative for diaphoresis, fatigue and fever.  HENT: Negative.    Eyes: Negative.   Respiratory:  Negative for cough, chest tightness, shortness of breath and wheezing.   Gastrointestinal:  Positive for diarrhea. Negative for abdominal  pain, anal bleeding, blood in stool, nausea and vomiting.  Endocrine: Negative.   Genitourinary: Negative.  Negative for difficulty urinating.  Musculoskeletal: Negative.   Skin: Negative.   Neurological:  Negative for dizziness, weakness and headaches.  Hematological:  Negative for adenopathy. Does not bruise/bleed easily.  Psychiatric/Behavioral: Negative.      Objective:  BP 114/72 (BP Location: Left Arm, Patient Position: Sitting, Cuff Size: Normal)   Pulse 64   Temp 98.1 F (36.7 C) (Oral)   Ht 5' 10"  (1.778 m)   Wt 155 lb (70.3 kg)   SpO2 97%   BMI 22.24 kg/m   BP Readings from Last 3 Encounters:  01/12/22 114/72  08/31/21 126/82  03/04/21 122/82    Wt Readings from Last 3 Encounters:  01/12/22 155 lb (70.3 kg)  08/31/21 155 lb (70.3 kg)  03/04/21 154 lb 9.6 oz (70.1 kg)    Physical Exam Vitals reviewed.  Constitutional:      Appearance: He is not ill-appearing.  HENT:     Nose: Nose normal.     Mouth/Throat:     Mouth: Mucous membranes are moist.  Eyes:     General: No scleral icterus.    Conjunctiva/sclera: Conjunctivae normal.  Cardiovascular:     Rate and Rhythm: Normal rate and regular rhythm.     Heart  sounds: No murmur heard. Pulmonary:     Effort: Pulmonary effort is normal.     Breath sounds: No stridor. No wheezing, rhonchi or rales.  Abdominal:     General: Abdomen is flat. Bowel sounds are normal. There is no distension.     Palpations: Abdomen is soft. There is no hepatomegaly, splenomegaly or mass.     Tenderness: There is no abdominal tenderness.  Musculoskeletal:        General: Normal range of motion.     Cervical back: Neck supple.     Right lower leg: No edema.     Left lower leg: No edema.  Lymphadenopathy:     Cervical: No cervical adenopathy.  Skin:    General: Skin is warm and dry.  Neurological:     General: No focal deficit present.     Mental Status: He is alert.  Psychiatric:        Mood and Affect: Mood normal.         Behavior: Behavior normal.     Lab Results  Component Value Date   WBC 8.5 08/31/2021   HGB 16.4 08/31/2021   HCT 49.6 08/31/2021   PLT 156.0 08/31/2021   GLUCOSE 93 08/31/2021   CHOL 91 08/31/2021   TRIG 181.0 (H) 08/31/2021   HDL 44.60 08/31/2021   LDLCALC 10 08/31/2021   ALT 22 07/16/2020   AST 21 07/16/2020   NA 140 08/31/2021   K 4.5 08/31/2021   CL 102 08/31/2021   CREATININE 1.26 08/31/2021   BUN 14 08/31/2021   CO2 29 08/31/2021   TSH 2.18 08/31/2021   PSA 0.98 05/22/2017   HGBA1C 5.2 08/31/2021    US ABDOMEN LIMITED RUQ (LIVER/GB)  Result Date: 03/18/2021 CLINICAL DATA:  Gallstones EXAM: ULTRASOUND ABDOMEN LIMITED RIGHT UPPER QUADRANT COMPARISON:  11/09/2020 FINDINGS: Gallbladder: Small stone near the gallbladder neck. Normal wall thickness. Negative sonographic Murphy. Common bile duct: Diameter: 3.4 mm Liver: Slightly echogenic. Focal hyper and hypoechoic regions within the right and left hepatic lobes. Portal vein is patent on color Doppler imaging with normal direction of blood flow towards the liver. Other: None. IMPRESSION: 1. Small gallstone without sonographic features to suggest an acute cholecystitis 2. Heterogeneous slightly echogenic liver with areas of increased and decreased echogenicity within the right and left hepatic lobes, suspect this is related to heterogeneous fat infiltration but could be confirmed with MRI. Electronically Signed   By: Donavan Foil M.D.   On: 03/18/2021 21:34    Assessment & Plan:   Jeffrey Reilly was seen today for follow-up.  Diagnoses and all orders for this visit:  Crohn's disease of both small and large intestine with intestinal obstruction ([place])- He is in remission. Will refer to GI -     Ambulatory referral to Gastroenterology   I have discontinued Jeffrey Graff Jr.'s inFLIXimab and doxycycline. I am also having him maintain his Probiotic Product (PROBIOTIC DAILY PO), B-12, Ergocalciferol, potassium citrate, diltiazem, and  [place].  No orders of the defined types were placed in this encounter.    Follow-up: Return if symptoms worsen or fail to improve.  Scarlette Calico, MD

## 2022-01-14 ENCOUNTER — Encounter: Payer: Self-pay | Admitting: Internal Medicine

## 2022-01-14 NOTE — Patient Instructions (Signed)
Crohn's Disease  Crohn's disease is a long-lasting (chronic) disease that affects the gastrointestinal (GI) tract. Crohn's disease often causes irritation and inflammation in the small intestine and the beginning of the large intestine, but it can affect any part of the GI tract. Crohn's disease is part of a group of illnesses that are known as inflammatory bowel disease (IBD). Crohn's disease may start slowly and get worse over time. Symptoms may come and go. They may also go away for months or even years at a time (remission). What are the causes? The exact cause of this condition is not known. It may involve a response that causes your body's disease-fighting system (immune system) to attack healthy cells and tissues (autoimmune response). Bacteria, genes, and your environment may also play a role. What increases the risk? The following factors may make you more likely to develop this condition: Having a family member who has Crohn's disease, another IBD, or an autoimmune condition. Using products that contain nicotine or tobacco, such as cigarettes and e-cigarettes. Being in your 58s. Having [place] European ancestry. What are the signs or symptoms? The main symptoms of this condition involve your GI tract. These include: Diarrhea. Pain or cramping in the abdomen commonly felt in the lower right side of the abdomen. Frequent watery or bloody stools. Constipation. This may mean having: Fewer bowel movements in a week than normal. Difficulty having a bowel movement. Stools that are dry, hard, or larger than normal. Rectal bleeding or pain. An urgent need to have a bowel movement. The feeling that you are not finished having a bowel movement. Other symptoms may include: Unexplained weight loss. Tiredness (fatigue). Fever. Nausea or appetite loss. Joint pain. Vision changes. Red bumps or sores on the skin. Sores inside the mouth. How is this diagnosed? This condition may be  diagnosed based on: Your symptoms and medical history. A physical exam. Tests, which may include: Blood tests. Stool sample tests. Imaging tests, such as X-rays and CT scans. Tests to examine the inside of your intestines using a long, flexible tube that has a light and a camera on the end (colonoscopy). A procedure to remove tissue samples from inside your bowel for testing (biopsy). You may need to work with a health care provider who specializes in diseases of the digestive tract (gastroenterologist). How is this treated? There is no cure for this condition, and it affects each person differently. Treatment can help you manage your symptoms. Your treatment may include: Medicines. These may be used by themselves or with other treatments (combination therapy). You may be given medicines that help to: Reduce inflammation. Control your immune system activity. Fight infections. Relieve cramps and prevent diarrhea. Control your pain. Surgery. You may need surgery if: Medicines and other treatments are not working anymore. You develop complications from severe Crohn's disease. A section of your intestine becomes so damaged that it needs to be removed. Lifestyle changes: Maintaining eating or drinking restrictions. Reducing or eliminating use of alcohol or nicotine. Follow these instructions at home: Medicines Take over-the-counter and prescription medicines only as told by your health care provider. If you were prescribed an antibiotic, take it as told by your health care provider. Do not stop taking the antibiotic even if you start to feel better. Avoid taking ibuprofen or other NSAID medicines if possible. These can make Crohn's disease worse. Eating and drinking Talk with your health care provider or a registered dietitian about what diet is best for you. Drink enough fluid to keep your  urine pale yellow. If you are taking steroids to reduce inflammation, get plenty of calcium in your  diet to help keep your bones healthy. You may also consider taking a calcium supplement with vitamin D. Keep a food diary to identify foods that make your symptoms better or worse, and avoid foods that cause symptoms. Follow instructions from your health care provider about eating or drinking restrictions if you have worsening symptoms (flare-up). If you drink alcohol: Limit how much you have to: 0-1 drink a day for women who are not pregnant. 0-2 drinks a day for men. Know how much alcohol is in a drink. In the [place] one drink equals one 12 oz bottle of beer (355 mL), one 5 oz glass of wine (148 mL), or one 1 oz glass of hard liquor (44 mL). General instructions Make sure you get all the vaccines that your health care provider recommends, especially pneumonia (pneumococcal) and flu (influenza) vaccines. Do not use any products that contain nicotine or tobacco. These products include cigarettes, chewing tobacco, and vaping devices, such as e-cigarettes. If you need help quitting, ask your health care provider. Exercise every day, or as often as told by your health care provider. Keep all follow-up visits. This is important. Contact a health care provider if: You have diarrhea, cramps in your abdomen, and other GI problems that are present almost all the time. Your symptoms do not improve with treatment, your symptoms get worse, or you develop new symptoms. You cannot pass stools. You continue to lose weight. You develop a rash or sores on your skin. You develop eye problems. You have a fever. Get help right away if: You have bloody diarrhea. You have severe pain in your abdomen. These symptoms may be an emergency. Get help right away. Call 911. Do not wait to see if the symptoms will go away. Do not drive yourself to the hospital. Summary Crohn's disease affects each person differently. The cause of this condition is not known, but it may involve a response that causes your body's immune  system to attack healthy cells and tissues. There are multiple treatment options that can help you manage the condition. Talk with your health care provider or a registered dietitian about what diet is best for you. Make sure you get all the vaccines that your health care provider recommends, especially pneumonia (pneumococcal) and flu (influenza) vaccines. Get help right away if you have bloody diarrhea or severe pain in your abdomen. This information is not intended to replace advice given to you by your health care provider. Make sure you discuss any questions you have with your health care provider. Document Revised: 09/10/2020 Document Reviewed: 09/10/2020 Elsevier Patient Education  [place].

## 2022-02-07 DIAGNOSIS — N200 Calculus of kidney: Secondary | ICD-10-CM | POA: Diagnosis not present

## 2022-02-14 DIAGNOSIS — K90829 Short bowel syndrome, unspecified: Secondary | ICD-10-CM | POA: Diagnosis not present

## 2022-02-14 DIAGNOSIS — K432 Incisional hernia without obstruction or gangrene: Secondary | ICD-10-CM | POA: Diagnosis not present

## 2022-02-14 DIAGNOSIS — N200 Calculus of kidney: Secondary | ICD-10-CM | POA: Diagnosis not present

## 2022-02-14 DIAGNOSIS — K8020 Calculus of gallbladder without cholecystitis without obstruction: Secondary | ICD-10-CM | POA: Diagnosis not present

## 2022-02-14 DIAGNOSIS — K5000 Crohn's disease of small intestine without complications: Secondary | ICD-10-CM | POA: Diagnosis not present

## 2022-02-14 DIAGNOSIS — Z7960 Long term (current) use of unspecified immunomodulators and immunosuppressants: Secondary | ICD-10-CM | POA: Diagnosis not present

## 2022-02-14 DIAGNOSIS — Z79899 Other long term (current) drug therapy: Secondary | ICD-10-CM | POA: Diagnosis not present

## 2022-02-14 DIAGNOSIS — Z23 Encounter for immunization: Secondary | ICD-10-CM | POA: Diagnosis not present

## 2022-02-22 DIAGNOSIS — R82991 Hypocitraturia: Secondary | ICD-10-CM | POA: Diagnosis not present

## 2022-02-22 DIAGNOSIS — Z87442 Personal history of urinary calculi: Secondary | ICD-10-CM | POA: Diagnosis not present

## 2022-02-22 DIAGNOSIS — K5090 Crohn's disease, unspecified, without complications: Secondary | ICD-10-CM | POA: Diagnosis not present

## 2022-02-23 DIAGNOSIS — Z125 Encounter for screening for malignant neoplasm of prostate: Secondary | ICD-10-CM | POA: Diagnosis not present

## 2022-02-23 DIAGNOSIS — K5090 Crohn's disease, unspecified, without complications: Secondary | ICD-10-CM | POA: Diagnosis not present

## 2022-04-19 DIAGNOSIS — K5090 Crohn's disease, unspecified, without complications: Secondary | ICD-10-CM | POA: Diagnosis not present

## 2022-04-19 DIAGNOSIS — Z7960 Long term (current) use of unspecified immunomodulators and immunosuppressants: Secondary | ICD-10-CM | POA: Diagnosis not present

## 2022-04-19 DIAGNOSIS — K5000 Crohn's disease of small intestine without complications: Secondary | ICD-10-CM | POA: Diagnosis not present

## 2022-04-20 DIAGNOSIS — K5090 Crohn's disease, unspecified, without complications: Secondary | ICD-10-CM | POA: Diagnosis not present

## 2022-05-10 DIAGNOSIS — L309 Dermatitis, unspecified: Secondary | ICD-10-CM | POA: Diagnosis not present

## 2022-06-14 DIAGNOSIS — K5090 Crohn's disease, unspecified, without complications: Secondary | ICD-10-CM | POA: Diagnosis not present

## 2022-06-15 DIAGNOSIS — K5090 Crohn's disease, unspecified, without complications: Secondary | ICD-10-CM | POA: Diagnosis not present

## 2022-07-29 DIAGNOSIS — K5090 Crohn's disease, unspecified, without complications: Secondary | ICD-10-CM | POA: Diagnosis not present

## 2022-08-01 ENCOUNTER — Encounter: Payer: Self-pay | Admitting: Internal Medicine

## 2022-08-08 DIAGNOSIS — K5090 Crohn's disease, unspecified, without complications: Secondary | ICD-10-CM | POA: Diagnosis not present

## 2022-08-12 DIAGNOSIS — K5090 Crohn's disease, unspecified, without complications: Secondary | ICD-10-CM | POA: Diagnosis not present

## 2022-08-27 IMAGING — US US ABDOMEN LIMITED
1 series · 14 of 25 positions shown · non-contrast
Comparison: September 29, 2020

CLINICAL DATA: Follow-up gallstones.

EXAM:
ULTRASOUND ABDOMEN LIMITED RIGHT UPPER QUADRANT

[Series 1: us abdomen limited · 0.11mm/px · 14 of 43 slices shown]
[im 1/43]
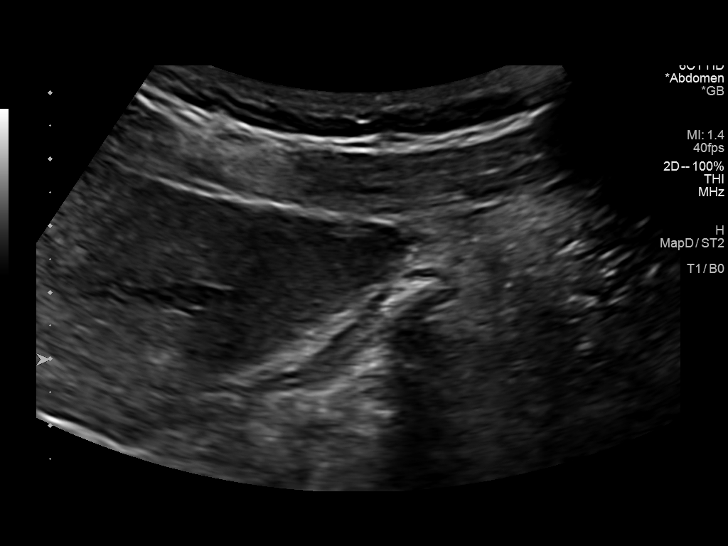
[im 4/43]
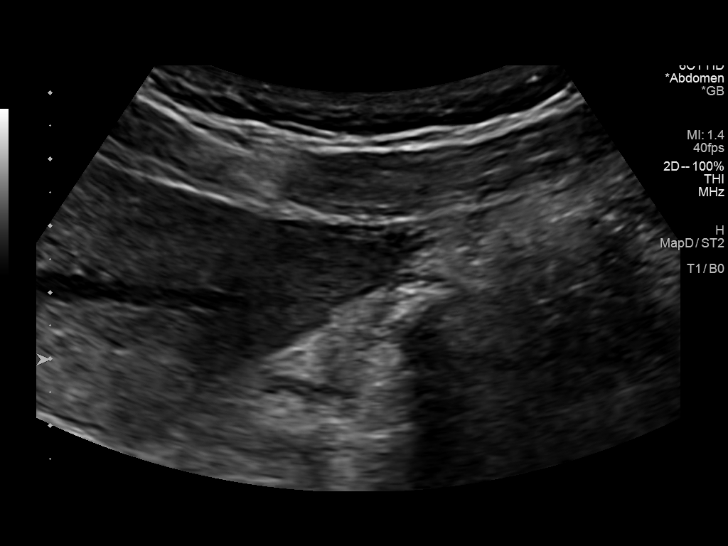
[im 8/43]
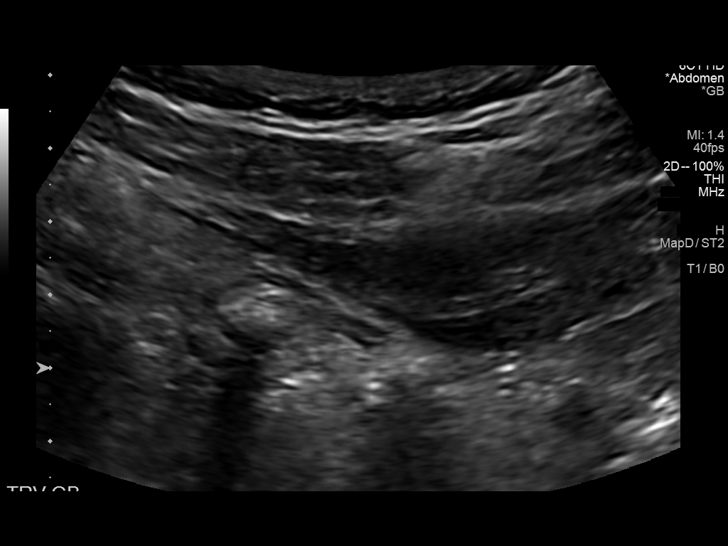
[im 11/43]
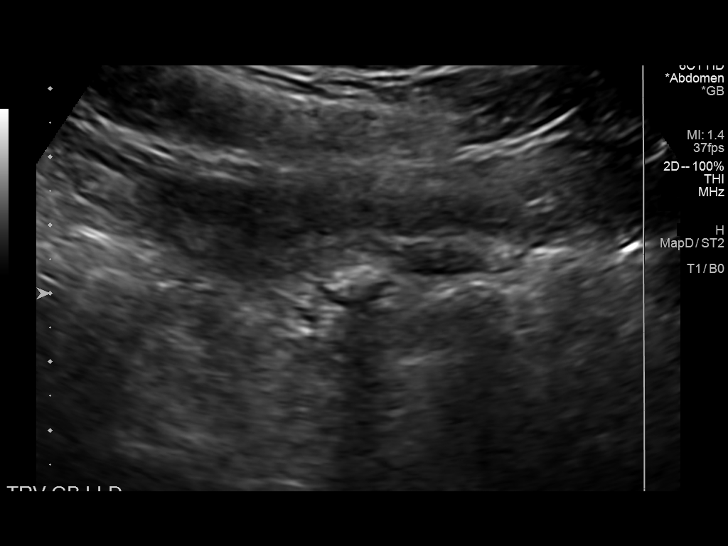
[im 15/43]
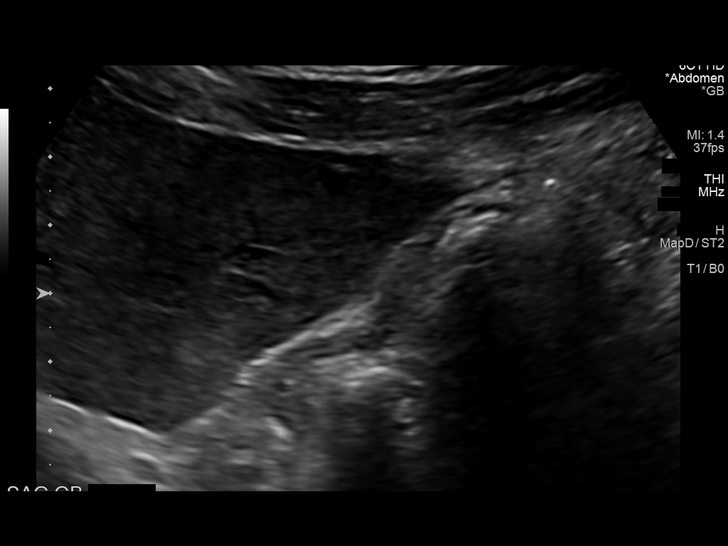
[im 16/43]
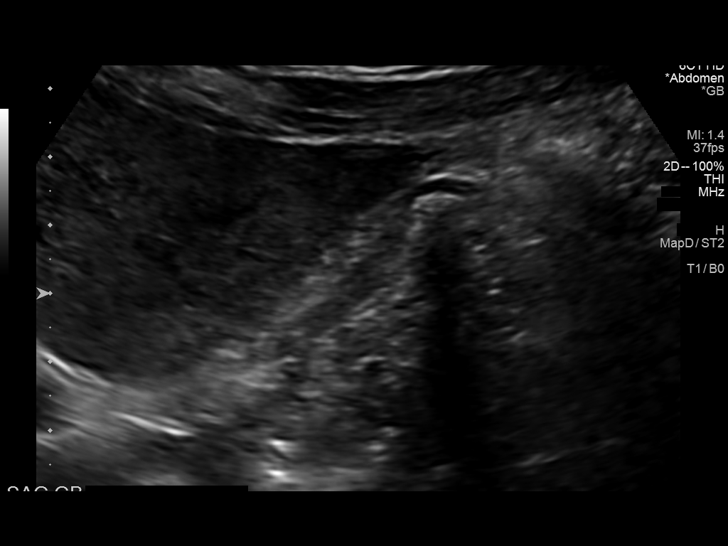
[im 20/43]
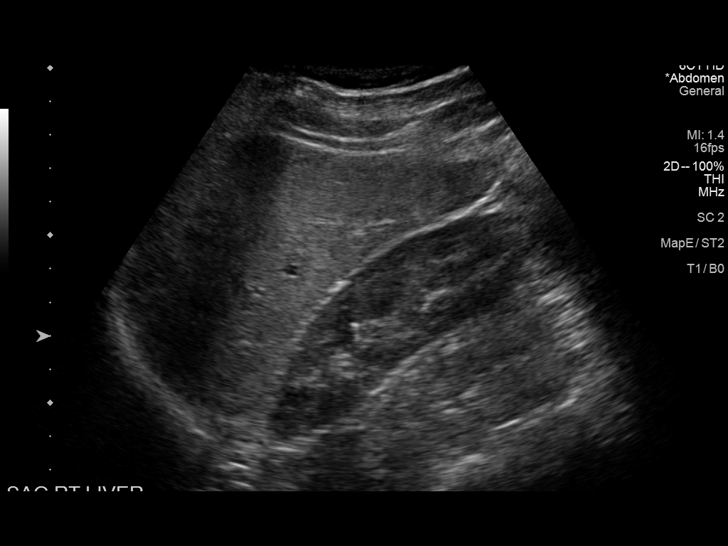
[im 23/43]
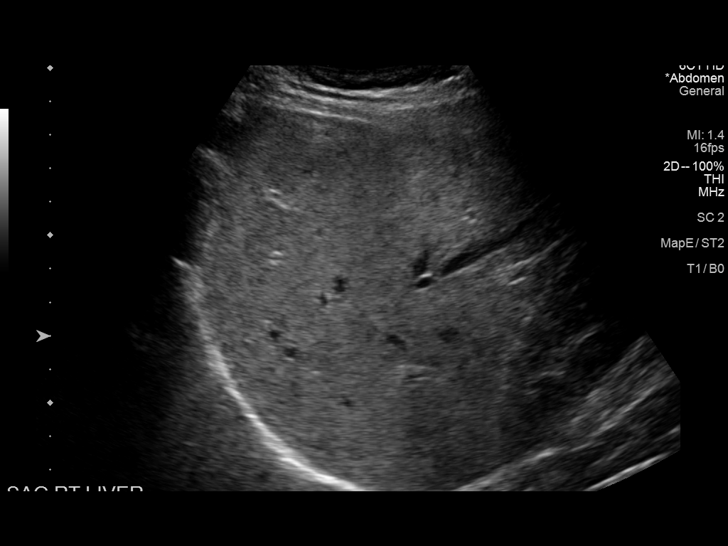
[im 27/43]
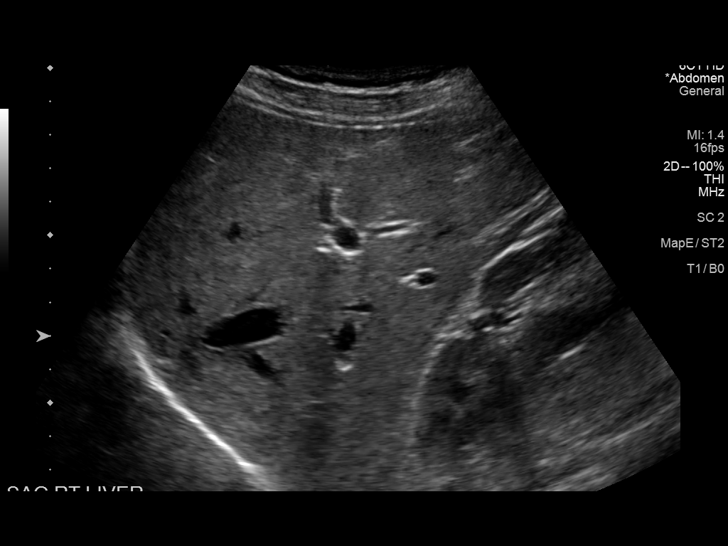
[im 29/43]
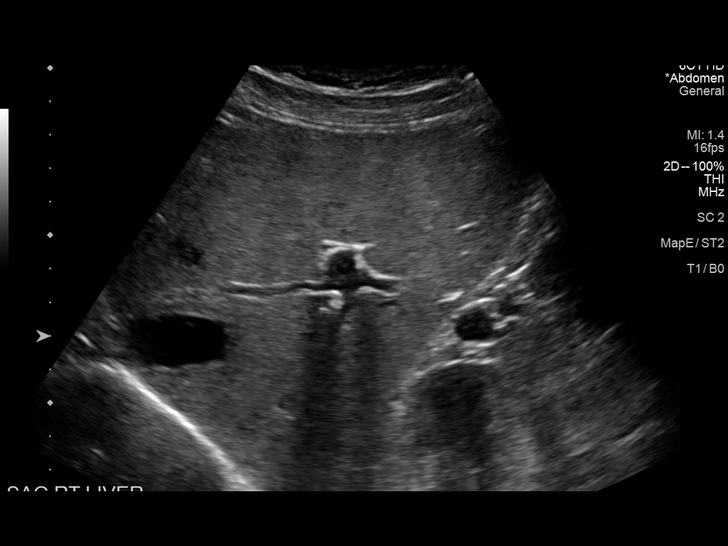
[im 32/43]
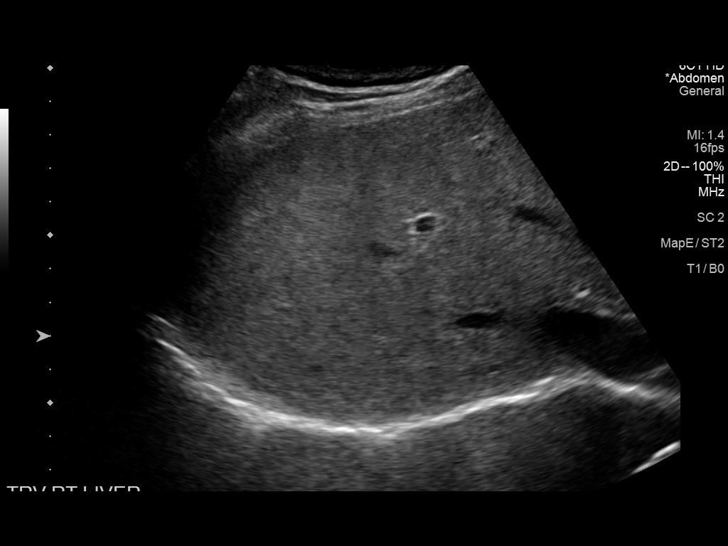
[im 36/43]
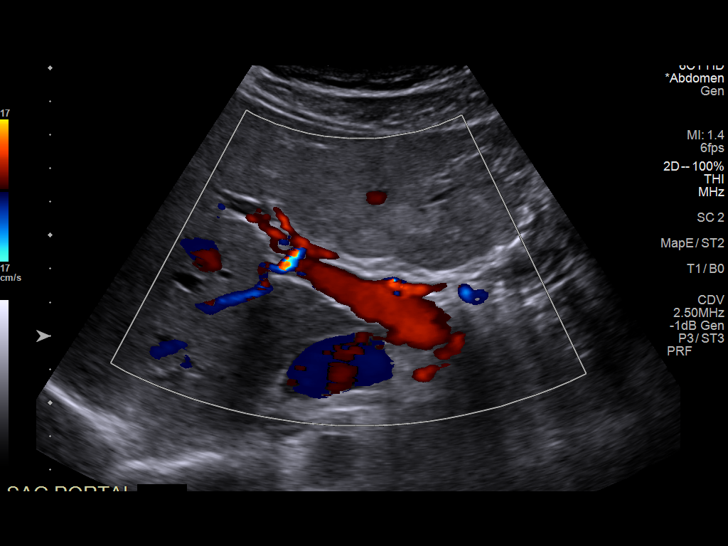
[im 39/43]
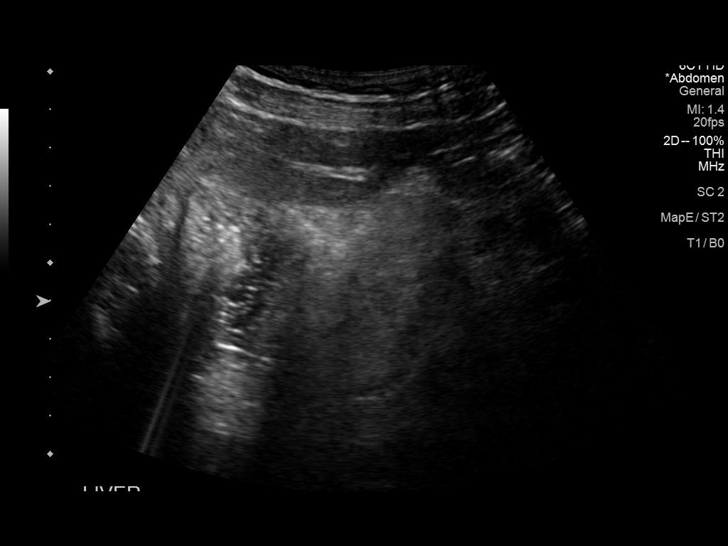
[im 43/43]
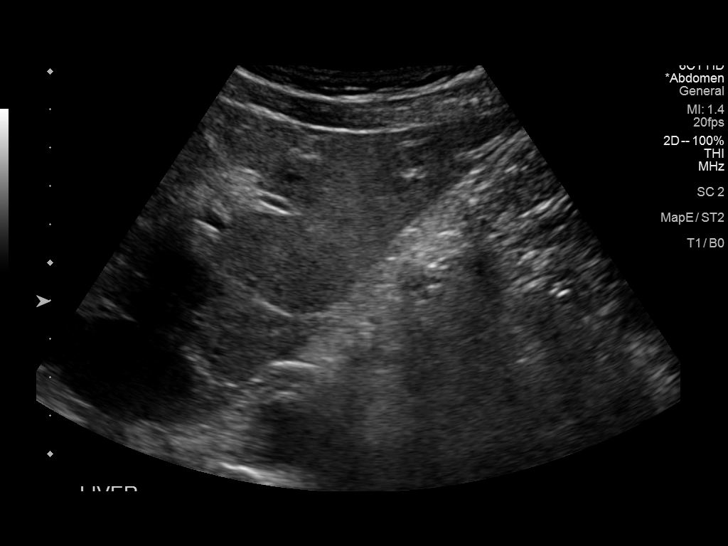

[14 of 25 positions shown; findings below may reference images not displayed]

FINDINGS: Gallbladder:

A 12 mm shadowing echogenic gallstone is seen within a contracted
gallbladder lumen. There is no evidence of gallbladder wall
thickening (2.1 mm). No sonographic Murphy sign noted by
sonographer.

Common bile duct:

Diameter: 2.2 mm

Liver:

No focal lesion identified. The parenchymal echogenicity is mildly
in homogeneous in appearance. Portal vein is patent on color Doppler
imaging with normal direction of blood flow towards the liver.

Other: None.
IMPRESSION: Cholelithiasis within a contracted gallbladder, unchanged in
appearance when compared to the prior study.

## 2022-10-03 DIAGNOSIS — K5090 Crohn's disease, unspecified, without complications: Secondary | ICD-10-CM | POA: Diagnosis not present

## 2022-10-10 DIAGNOSIS — K5090 Crohn's disease, unspecified, without complications: Secondary | ICD-10-CM | POA: Diagnosis not present

## 2022-10-10 LAB — COMPREHENSIVE METABOLIC PANEL: Calcium: 10 (ref 8.7–10.7)

## 2022-10-10 LAB — CBC AND DIFFERENTIAL
HCT: 52 (ref 41–53)
Hemoglobin: 17.1 (ref 13.5–17.5)
Platelets: 186 10*3/uL (ref 150–400)
WBC: 8.3

## 2022-10-10 LAB — HEPATIC FUNCTION PANEL
ALT: 16 U/L (ref 10–40)
AST: 24 (ref 14–40)
Alkaline Phosphatase: 55 (ref 25–125)
Bilirubin, Total: 1.3

## 2022-10-10 LAB — BASIC METABOLIC PANEL
BUN: 11 (ref 4–21)
CO2: 24 — AB (ref 13–22)
Chloride: 101 (ref 99–108)
Creatinine: 1.3 (ref 0.6–1.3)
Glucose: 131
Potassium: 4.7 meq/L (ref 3.5–5.1)
Sodium: 141 (ref 137–147)

## 2022-11-15 ENCOUNTER — Encounter: Payer: Self-pay | Admitting: Internal Medicine

## 2022-11-15 DIAGNOSIS — Z8719 Personal history of other diseases of the digestive system: Secondary | ICD-10-CM | POA: Diagnosis not present

## 2022-11-15 DIAGNOSIS — Z23 Encounter for immunization: Secondary | ICD-10-CM | POA: Diagnosis not present

## 2022-11-15 DIAGNOSIS — Z980 Intestinal bypass and anastomosis status: Secondary | ICD-10-CM | POA: Diagnosis not present

## 2022-11-15 DIAGNOSIS — K50012 Crohn's disease of small intestine with intestinal obstruction: Secondary | ICD-10-CM | POA: Diagnosis not present

## 2022-11-15 DIAGNOSIS — Z9189 Other specified personal risk factors, not elsewhere classified: Secondary | ICD-10-CM | POA: Diagnosis not present

## 2022-11-24 ENCOUNTER — Ambulatory Visit (HOSPITAL_COMMUNITY)
Admission: RE | Admit: 2022-11-24 | Discharge: 2022-11-24 | Disposition: A | Discharge status: Home / Self Care | Payer: BC Managed Care – PPO | Source: Ambulatory Visit | Attending: Physician Assistant | Admitting: Physician Assistant

## 2022-11-24 ENCOUNTER — Other Ambulatory Visit (HOSPITAL_COMMUNITY): Payer: Self-pay | Admitting: Physician Assistant

## 2022-11-24 ENCOUNTER — Encounter (HOSPITAL_COMMUNITY): Payer: Self-pay | Admitting: Physician Assistant

## 2022-11-24 VITALS — BP 140/74 | HR 76 | Ht 70.0 in | Wt 159.2 lb

## 2022-11-24 DIAGNOSIS — R079 Chest pain, unspecified: Secondary | ICD-10-CM | POA: Insufficient documentation

## 2022-11-24 DIAGNOSIS — Z8249 Family history of ischemic heart disease and other diseases of the circulatory system: Secondary | ICD-10-CM | POA: Diagnosis not present

## 2022-11-24 DIAGNOSIS — I480 Paroxysmal atrial fibrillation: Secondary | ICD-10-CM | POA: Insufficient documentation

## 2022-11-24 NOTE — Addendum Note (Signed)
Encounter addended by: Danice Goltz, PA on: 11/24/2022 10:45 AM  Actions taken: Clinical Note Signed

## 2022-11-24 NOTE — Progress Notes (Addendum)
Primary Care Physician: Etta Grandchild, MD Primary Cardiologist: none Primary Electrophysiologist: none Referring Physician: [facility]   Jeffrey Reilly. is a 42 y.o. male with a history of Crohn disease and paroxysmal atrial fibrillation who presents for follow up in the [facility]. The patient was initially diagnosed with atrial fibrillation on 08/29/19 after presenting to the ED with symptoms of palpitations. He converted to SR in the ER without intervention. Patient is not on anticoagulation for a CHADS2VASC score of 0. There were no specific triggers that he could identify. He denies snoring or significant alcohol use.   On follow up today, patient reports that he has had three episodes of chest discomfort over the past two weeks. Two of them were associated with activity. The discomfort felt like a tightness in the center of his chest, 3-4/10 in severity. The first two episodes lasted 20-30 seconds but the third lasted longer. No other associated symptoms. He denies any tachypalpitations or irregular heart beats.   Today, he denies symptoms of palpitations, shortness of breath, orthopnea, PND, lower extremity edema, dizziness, presyncope, syncope, snoring, daytime somnolence, bleeding, or neurologic sequela. The patient is tolerating medications without difficulties and is otherwise without complaint today.    Atrial Fibrillation Risk Factors:  he does not have symptoms or diagnosis of sleep apnea. he does not have a history of rheumatic fever. he does not have a history of alcohol use. The patient does have a history of early familial atrial fibrillation or other arrhythmias. Mother has afib.   Atrial Fibrillation Management history:  Previous antiarrhythmic drugs: none Previous cardioversions: none Previous ablations: none Anticoagulation history: none   Past Medical History:  Diagnosis Date   Crohn's disease of both small and large  intestine with intestinal obstruction (H[facility]) 03/2012   treated in [place]   GERD (gastroesophageal reflux disease)    History of kidney stones     Current Outpatient Medications  Medication Sig Dispense Refill   Cyanocobalamin (B-12) 100 MCG TABS Take by mouth.     diltiazem (CARDIZEM) 30 MG tablet Take 1 tablet every 4 hours AS NEEDED for AFIB heart rate >100 45 tablet 1   Ergocalciferol 10 MCG (400 UNIT) TABS Take by mouth.     inFLIXimab-axxq (AVSOLA) 100 MG injection Inject into the vein.     potassium citrate (UROCIT-K) 5 MEQ (540 MG) SR tablet Take 15 mEq by mouth 2 (two) times daily.     Probiotic Product (PROBIOTIC DAILY PO) Take 1 capsule by mouth every morning.      No current facility-administered medications for this encounter.    ROS- All systems are reviewed and negative except as per the HPI above.  Physical Exam: Vitals:   11/24/22 1010  BP: (!) 140/74  Pulse: 76  Weight: 72.2 kg  Height: 5\' 10"  (1.778 m)     GEN: Well nourished, well developed in no acute distress NECK: No JVD; No carotid bruits CARDIAC: Regular rate and rhythm, no murmurs, rubs, gallops RESPIRATORY:  Clear to auscultation without rales, wheezing or rhonchi  ABDOMEN: Soft, non-tender, non-distended EXTREMITIES:  No edema; No deformity    Wt Readings from Last 3 Encounters:  11/24/22 72.2 kg  01/12/22 70.3 kg  08/31/21 70.3 kg    EKG today demonstrates  SR Vent. rate 76 BPM PR interval 146 ms QRS duration 80 ms QT/QTcB 374/420 ms  Echo 09/11/19 demonstrated  1. Left ventricular ejection fraction, by estimation, is 60 to  65%. The  left ventricle has normal function. The left ventricle has no regional  wall motion abnormalities. Left ventricular diastolic parameters were  normal.   2. Right ventricular systolic function is normal. The right ventricular  size is normal. Tricuspid regurgitation signal is inadequate for assessing PA pressure.   3. The mitral valve is normal in  structure. No evidence of mitral valve  regurgitation. No evidence of mitral stenosis.   4. The aortic valve is tricuspid. Aortic valve regurgitation is not  visualized. Mild aortic valve sclerosis is present, with no evidence of  aortic valve stenosis.   5. The inferior vena cava is normal in size with greater than 50%  respiratory variability, suggesting right atrial pressure of 3 mmHg.   Epic records are reviewed at length today  CHA2DS2-VASc Score = 0  The patient's score is based upon: CHF History: 0 HTN History: 0 Diabetes History: 0 Stroke History: 0 Vascular Disease History: 0 Age Score: 0 Gender Score: 0         ASSESSMENT AND PLAN: Paroxysmal Atrial Fibrillation (ICD10:  I48.0) The patient's CHA2DS2-VASc score is 0, indicating a 0.2% annual risk of stroke.   Patient appears to be maintaining SR.  We discussed smart devices for home monitoring. Continue diltiazem 30 mg PRN q 4 hours for heart racing. No indication for anticoagulation at this time. Given his young age, would consider front line ablation if his afib becomes more persistent.   Chest pain Patient having chest pain with typical and atypical features. His father had an MI at age 47. He does not have other risk factors. No acute changes on ECG today Will order a TST to evaluate.    Follow up based on results of stress test.    Informed Consent   Shared Decision Making/Informed Consent The risks [chest pain, shortness of breath, cardiac arrhythmias, dizziness, blood pressure fluctuations, myocardial infarction, stroke/transient ischemic attack, and life-threatening complications (estimated to be 1 in 10,000)], benefits (risk stratification, diagnosing coronary artery disease, treatment guidance) and alternatives of an exercise tolerance test were discussed in detail with Mr. [name] and he agrees to proceed.      Jorja Loa PA-C [facility] [address] [place], [place]  [address] [phone] 11/24/2022 10:36 AM

## 2022-11-30 DIAGNOSIS — K5090 Crohn's disease, unspecified, without complications: Secondary | ICD-10-CM | POA: Diagnosis not present

## 2022-12-01 ENCOUNTER — Other Ambulatory Visit: Payer: Self-pay | Admitting: Internal Medicine

## 2022-12-01 ENCOUNTER — Other Ambulatory Visit (HOSPITAL_COMMUNITY): Payer: Self-pay | Admitting: *Deleted

## 2022-12-01 DIAGNOSIS — Z79620 Long term (current) use of immunosuppressive biologic: Secondary | ICD-10-CM

## 2022-12-01 DIAGNOSIS — Z13820 Encounter for screening for osteoporosis: Secondary | ICD-10-CM | POA: Insufficient documentation

## 2022-12-01 DIAGNOSIS — Z7960 Long term (current) use of unspecified immunomodulators and immunosuppressants: Secondary | ICD-10-CM

## 2022-12-01 DIAGNOSIS — R079 Chest pain, unspecified: Secondary | ICD-10-CM

## 2022-12-06 ENCOUNTER — Ambulatory Visit: Payer: BC Managed Care – PPO | Attending: Cardiovascular Disease

## 2022-12-06 DIAGNOSIS — R079 Chest pain, unspecified: Secondary | ICD-10-CM | POA: Diagnosis not present

## 2022-12-06 LAB — EXERCISE TOLERANCE TEST
Angina Index: 0
Duke Treadmill Score: 13
Estimated workload: 16.1
Exercise duration (min): 13 min
Exercise duration (sec): 26 s
MPHR: 176 {beats}/min
Peak HR: 171 {beats}/min
Percent HR: 97 %
RPE: 18
Rest HR: 75 {beats}/min
ST Depression (mm): 0 mm

## 2022-12-07 ENCOUNTER — Encounter (HOSPITAL_COMMUNITY): Payer: Self-pay | Admitting: *Deleted

## 2022-12-07 DIAGNOSIS — K5090 Crohn's disease, unspecified, without complications: Secondary | ICD-10-CM | POA: Diagnosis not present

## 2022-12-26 IMAGING — US US ABDOMEN LIMITED
1 series · 14 of 25 positions shown · non-contrast
Comparison: 11/09/2020

CLINICAL DATA: Gallstones

EXAM:
ULTRASOUND ABDOMEN LIMITED RIGHT UPPER QUADRANT

[Series 1: us abdomen limited · 0.23mm/px · 14 of 76 slices shown]
[im 1/76]
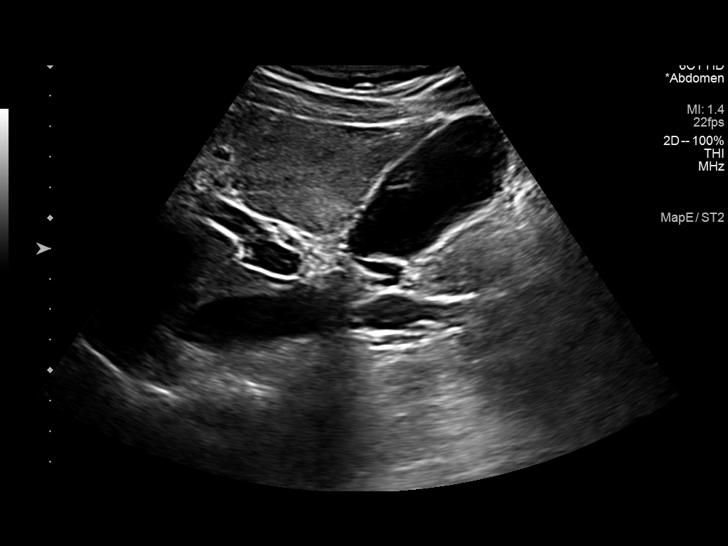
[im 7/76]
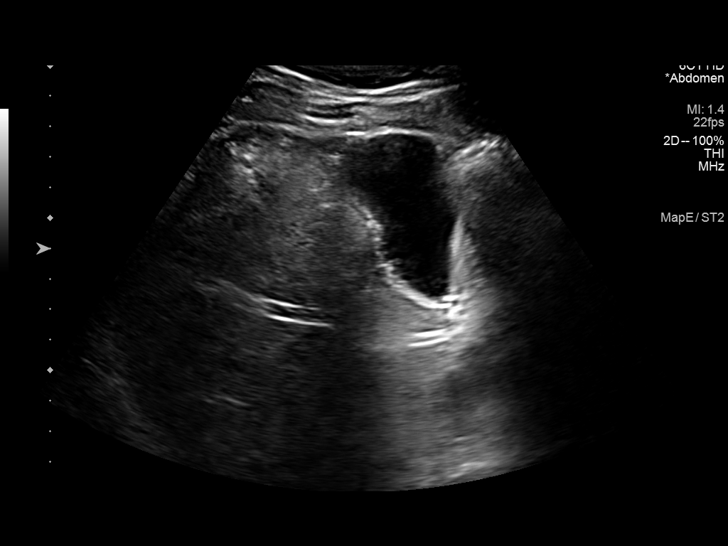
[im 13/76]
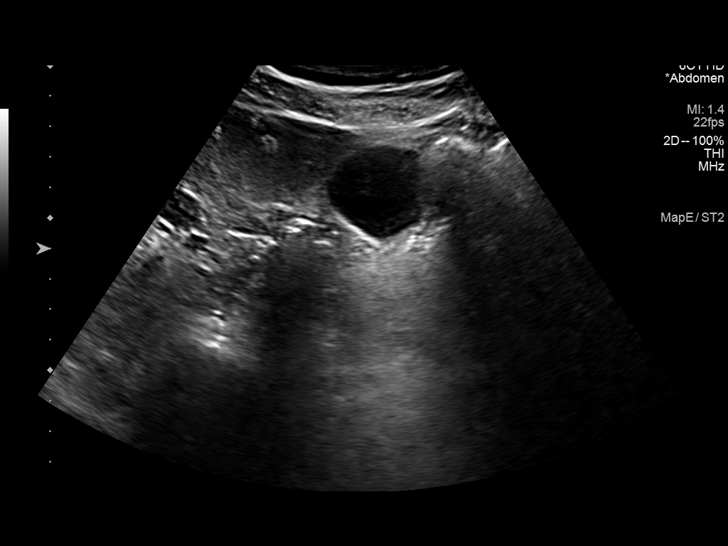
[im 19/76]
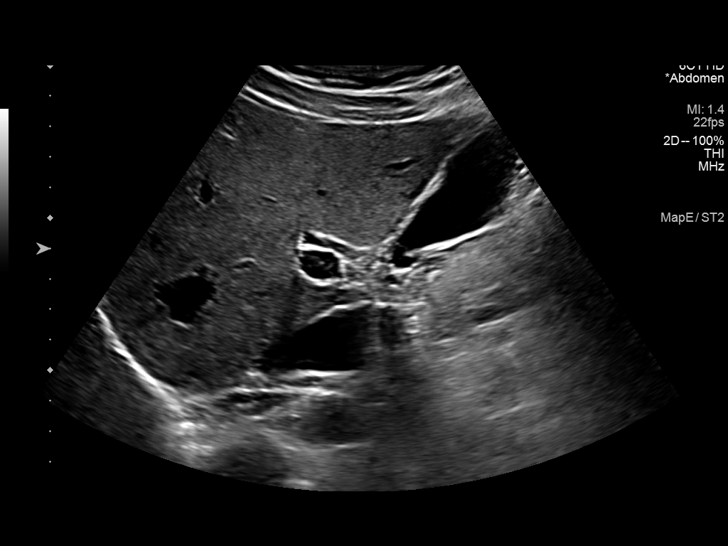
[im 26/76]
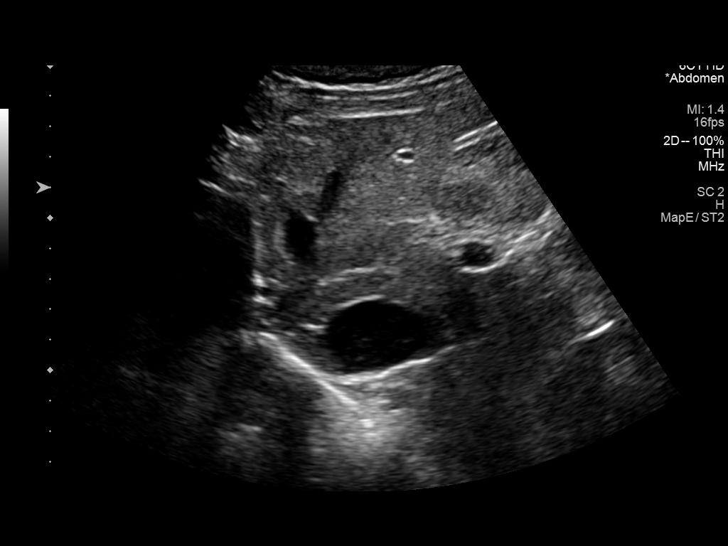
[im 29/76]
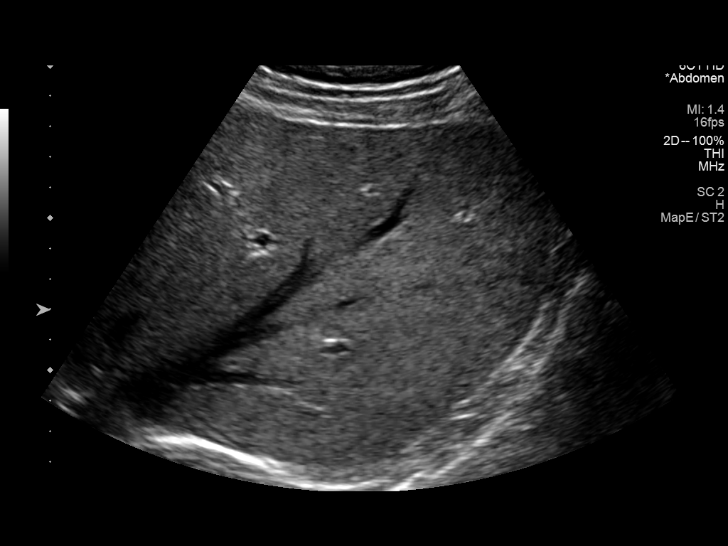
[im 35/76]
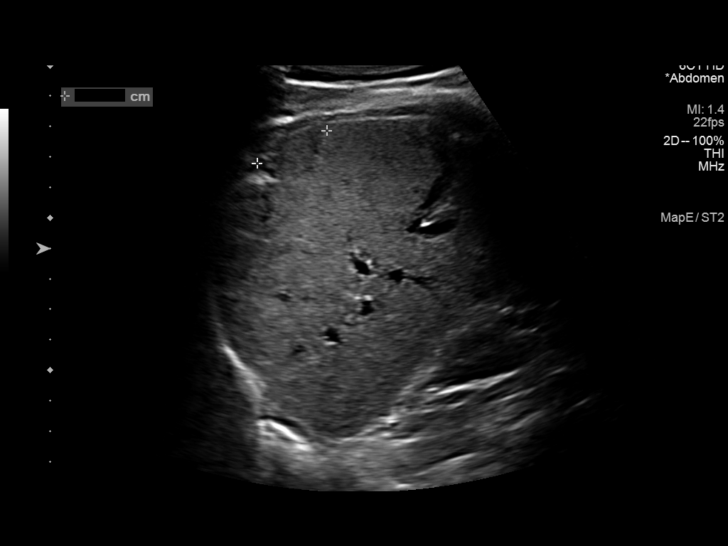
[im 41/76]
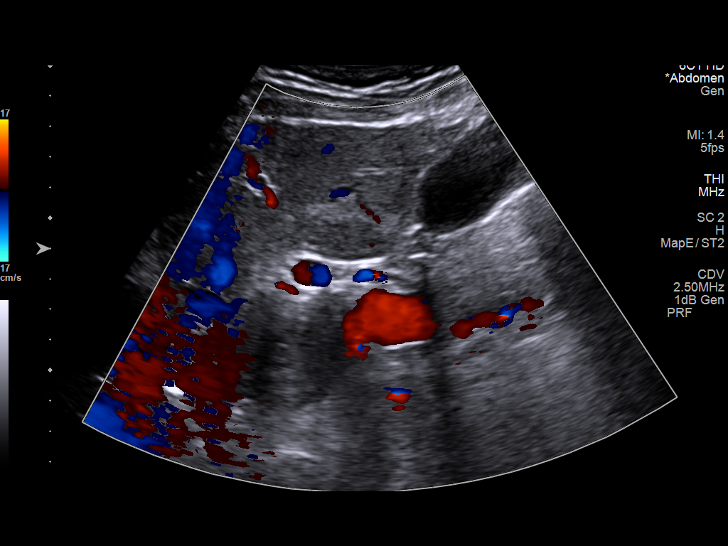
[im 47/76]
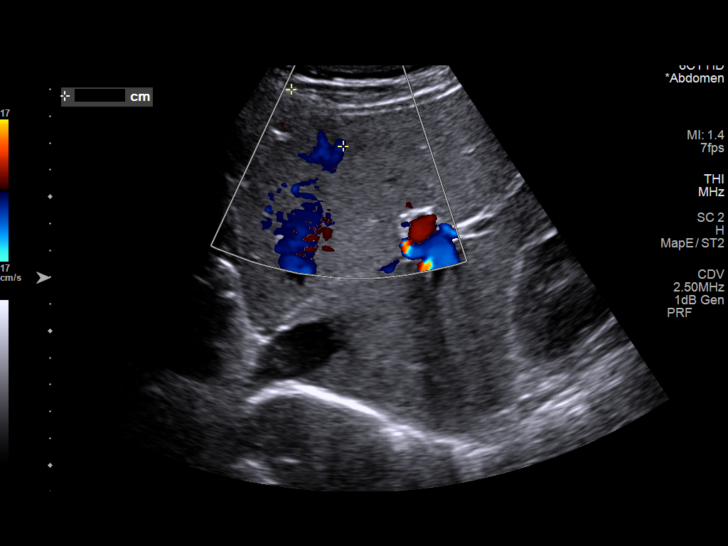
[im 51/76]
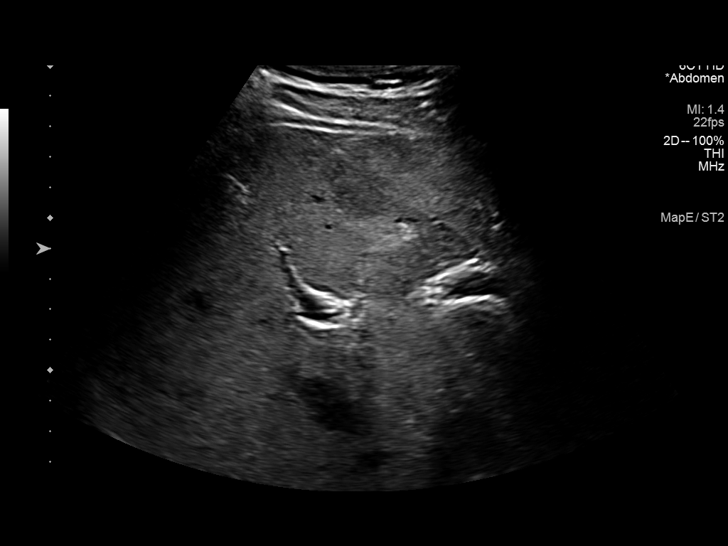
[im 57/76]
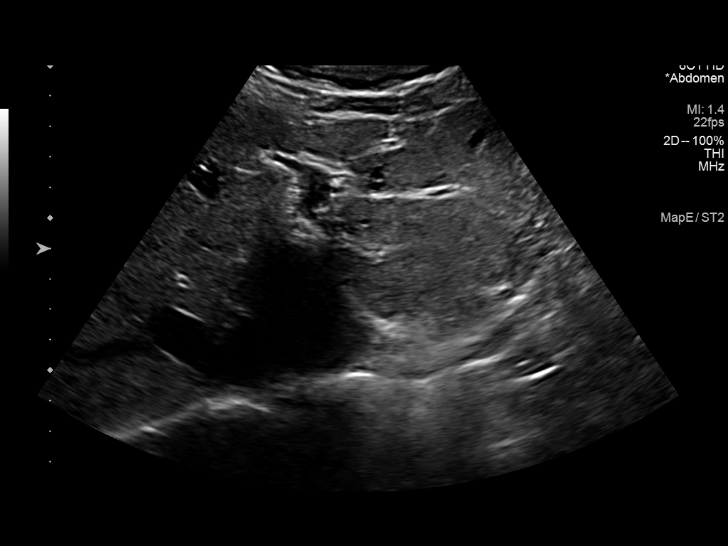
[im 63/76]
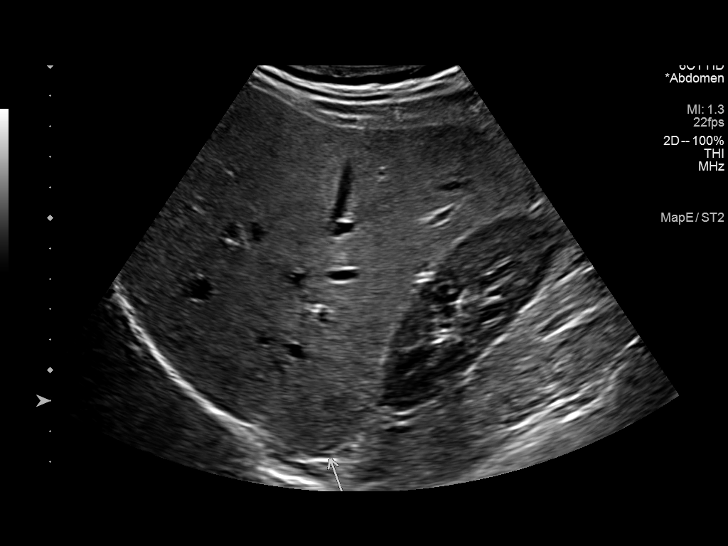
[im 69/76]
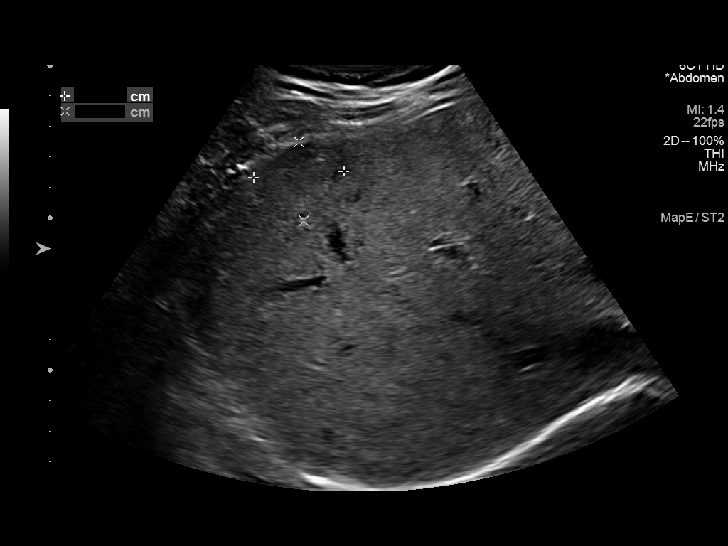
[im 76/76]
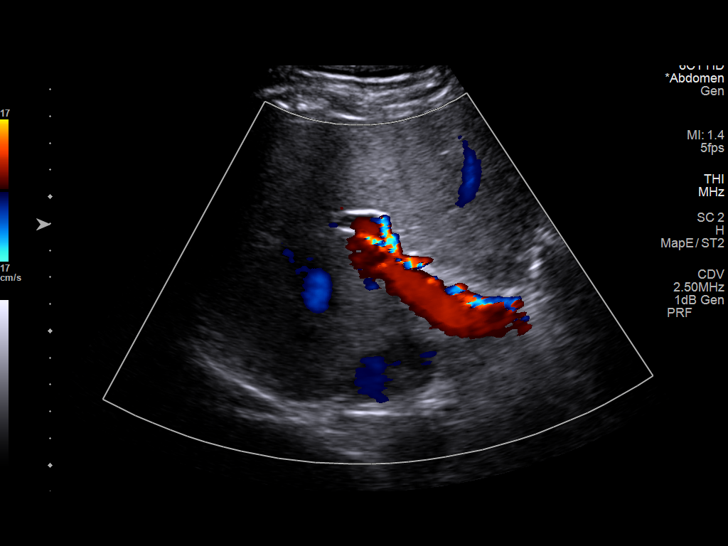

[14 of 25 positions shown; findings below may reference images not displayed]

FINDINGS: Gallbladder:

Small stone near the gallbladder neck. Normal wall thickness.
Negative sonographic Murphy.

Common bile duct:

Diameter: 3.4 mm

Liver:

Slightly echogenic. Focal hyper and hypoechoic regions within the
right and left hepatic lobes. Portal vein is patent on color Doppler
imaging with normal direction of blood flow towards the liver.

Other: None.
IMPRESSION: 1. Small gallstone without sonographic features to suggest an acute
cholecystitis
2. Heterogeneous slightly echogenic liver with areas of increased
and decreased echogenicity within the right and left hepatic lobes,
suspect this is related to heterogeneous fat infiltration but could
be confirmed with MRI.

## 2022-12-27 DIAGNOSIS — L821 Other seborrheic keratosis: Secondary | ICD-10-CM | POA: Diagnosis not present

## 2022-12-27 DIAGNOSIS — D225 Melanocytic nevi of trunk: Secondary | ICD-10-CM | POA: Diagnosis not present

## 2022-12-27 DIAGNOSIS — L814 Other melanin hyperpigmentation: Secondary | ICD-10-CM | POA: Diagnosis not present

## 2022-12-27 DIAGNOSIS — L853 Xerosis cutis: Secondary | ICD-10-CM | POA: Diagnosis not present

## 2022-12-28 ENCOUNTER — Ambulatory Visit (INDEPENDENT_AMBULATORY_CARE_PROVIDER_SITE_OTHER)
Admission: RE | Admit: 2022-12-28 | Discharge: 2022-12-28 | Disposition: A | Discharge status: Home / Self Care | Payer: BC Managed Care – PPO | Source: Ambulatory Visit | Attending: Internal Medicine | Admitting: Internal Medicine

## 2022-12-28 DIAGNOSIS — Z13820 Encounter for screening for osteoporosis: Secondary | ICD-10-CM | POA: Diagnosis not present

## 2022-12-28 DIAGNOSIS — Z7960 Long term (current) use of unspecified immunomodulators and immunosuppressants: Secondary | ICD-10-CM

## 2022-12-28 DIAGNOSIS — Z79620 Long term (current) use of immunosuppressive biologic: Secondary | ICD-10-CM | POA: Diagnosis not present

## 2023-01-11 DIAGNOSIS — K5000 Crohn's disease of small intestine without complications: Secondary | ICD-10-CM | POA: Diagnosis not present

## 2023-01-11 DIAGNOSIS — Z87442 Personal history of urinary calculi: Secondary | ICD-10-CM | POA: Diagnosis not present

## 2023-01-11 DIAGNOSIS — Z7960 Long term (current) use of unspecified immunomodulators and immunosuppressants: Secondary | ICD-10-CM | POA: Diagnosis not present

## 2023-01-11 DIAGNOSIS — K90829 Short bowel syndrome, unspecified: Secondary | ICD-10-CM | POA: Diagnosis not present

## 2023-01-11 DIAGNOSIS — K853 Drug induced acute pancreatitis without necrosis or infection: Secondary | ICD-10-CM | POA: Diagnosis not present

## 2023-01-11 DIAGNOSIS — T50905A Adverse effect of unspecified drugs, medicaments and biological substances, initial encounter: Secondary | ICD-10-CM | POA: Diagnosis not present

## 2023-01-11 DIAGNOSIS — K432 Incisional hernia without obstruction or gangrene: Secondary | ICD-10-CM | POA: Diagnosis not present

## 2023-01-20 ENCOUNTER — Other Ambulatory Visit (HOSPITAL_COMMUNITY): Payer: Self-pay

## 2023-01-20 MED ORDER — INFLUENZA VIRUS VACC SPLIT PF (FLUZONE) 0.5 ML IM SUSY
0.5000 mL | PREFILLED_SYRINGE | Freq: Once | INTRAMUSCULAR | 0 refills | Status: AC
  Filled 2023-01-20: qty 0.5, 1d supply, fill #0

## 2023-01-20 MED ORDER — COVID-19 MRNA VAC-TRIS(PFIZER) 30 MCG/0.3ML IM SUSY
0.3000 mL | PREFILLED_SYRINGE | Freq: Once | INTRAMUSCULAR | 0 refills | Status: AC
  Filled 2023-01-20: qty 0.3, 1d supply, fill #0

## 2023-01-27 DIAGNOSIS — K5090 Crohn's disease, unspecified, without complications: Secondary | ICD-10-CM | POA: Diagnosis not present

## 2023-02-01 DIAGNOSIS — K5090 Crohn's disease, unspecified, without complications: Secondary | ICD-10-CM | POA: Diagnosis not present

## 2023-02-03 ENCOUNTER — Encounter: Payer: Self-pay | Admitting: Internal Medicine

## 2023-02-07 DIAGNOSIS — N200 Calculus of kidney: Secondary | ICD-10-CM | POA: Diagnosis not present

## 2023-02-08 DIAGNOSIS — K5000 Crohn's disease of small intestine without complications: Secondary | ICD-10-CM | POA: Diagnosis not present

## 2023-02-20 ENCOUNTER — Encounter: Payer: Self-pay | Admitting: Internal Medicine

## 2023-02-20 ENCOUNTER — Ambulatory Visit (INDEPENDENT_AMBULATORY_CARE_PROVIDER_SITE_OTHER): Payer: BC Managed Care – PPO | Admitting: Internal Medicine

## 2023-02-20 VITALS — BP 120/80 | HR 64 | Temp 97.9°F | Resp 16 | Ht 70.0 in | Wt 158.6 lb

## 2023-02-20 DIAGNOSIS — D225 Melanocytic nevi of trunk: Secondary | ICD-10-CM | POA: Insufficient documentation

## 2023-02-20 DIAGNOSIS — R739 Hyperglycemia, unspecified: Secondary | ICD-10-CM | POA: Diagnosis not present

## 2023-02-20 DIAGNOSIS — Z0001 Encounter for general adult medical examination with abnormal findings: Secondary | ICD-10-CM

## 2023-02-20 DIAGNOSIS — D2262 Melanocytic nevi of left upper limb, including shoulder: Secondary | ICD-10-CM | POA: Insufficient documentation

## 2023-02-20 DIAGNOSIS — D234 Other benign neoplasm of skin of scalp and neck: Secondary | ICD-10-CM | POA: Insufficient documentation

## 2023-02-20 DIAGNOSIS — L309 Dermatitis, unspecified: Secondary | ICD-10-CM | POA: Insufficient documentation

## 2023-02-20 DIAGNOSIS — L821 Other seborrheic keratosis: Secondary | ICD-10-CM | POA: Insufficient documentation

## 2023-02-20 DIAGNOSIS — I480 Paroxysmal atrial fibrillation: Secondary | ICD-10-CM | POA: Diagnosis not present

## 2023-02-20 LAB — BASIC METABOLIC PANEL
BUN: 17 mg/dL (ref 6–23)
CO2: 30 meq/L (ref 19–32)
Calcium: 9.6 mg/dL (ref 8.4–10.5)
Chloride: 103 meq/L (ref 96–112)
Creatinine, Ser: 1.26 mg/dL (ref 0.40–1.50)
GFR: 69.48 mL/min (ref >60.00)
Glucose, Bld: 94 mg/dL (ref 70–99)
Potassium: 4.7 meq/L (ref 3.5–5.1)
Sodium: 140 meq/L (ref 135–145)

## 2023-02-20 LAB — HEMOGLOBIN A1C: Hgb A1c MFr Bld: 5.1 % (ref 4.6–6.5)

## 2023-02-20 LAB — TSH: TSH: 3.16 u[IU]/mL (ref 0.35–5.50)

## 2023-02-20 NOTE — Progress Notes (Unsigned)
Subjective:  Patient ID: Jeffrey Haver., male    DOB: 1980/09/15  Age: 42 y.o. MRN: [id number]  CC: Annual Exam   HPI Jeffrey Reilly. presents for a CPX and f/up ---  Discussed the use of AI scribe software for clinical note transcription with the patient, who gave verbal consent to proceed.  History of Present Illness   The patient, with a history of Crohn's disease and kidney stones, reports overall good health with no recent flare-ups of Crohn's disease. He has a colonoscopy scheduled for next week, a routine procedure performed every five years due to his medical history. He denies any recent nausea, vomiting, or abdominal pain.  He reports occasional chest pain, which was evaluated with a walking test and consultation with a cardiologist. No abnormalities were found and the chest pain has not recurred. He occasionally takes calcium citrate for indigestion, as recommended by his urologist to help reduce kidney stones. He also takes potassium citrate with meals twice a day.  Despite elevated oxalate levels in a recent 24-hour urine test, the patient denies any symptoms of kidney stones. He has a urologist appointment scheduled for the following day to discuss these results.  The patient struggles with maintaining adequate hydration but reports improvement over time. He has gained a little weight recently but denies any changes in appetite or night sweats. He has been working out more regularly, which he believes is beneficial.  The patient's blood sugar was slightly elevated at 130 in previous labs, but he attributes this to eating breakfast before the test. He has not eaten prior to today's visit in anticipation of repeat labs. He also inquires about cholesterol checks and blood sugar tests, specifically the A1c. His cholesterol level was normal a year ago and he does not take any cholesterol medication.       Outpatient Medications Prior to Visit  Medication Sig Dispense Refill    Cyanocobalamin (B-12) 100 MCG TABS Take by mouth.     diltiazem (CARDIZEM) 30 MG tablet Take 1 tablet every 4 hours AS NEEDED for AFIB heart rate >100 45 tablet 1   Ergocalciferol 10 MCG (400 UNIT) TABS Take by mouth.     inFLIXimab-axxq (AVSOLA) 100 MG injection Inject into the vein.     potassium citrate (UROCIT-K) 5 MEQ (540 MG) SR tablet Take 15 mEq by mouth 2 (two) times daily.     Probiotic Product (PROBIOTIC DAILY PO) Take 1 capsule by mouth every morning.      No facility-administered medications prior to visit.    ROS Review of Systems  Objective:  BP 120/80 (BP Location: Left Arm, Patient Position: Sitting, Cuff Size: Normal)   Pulse 64   Temp 97.9 F (36.6 C) (Oral)   Resp 16   Ht 5\' 10"  (1.778 m)   Wt 158 lb 9.6 oz (71.9 kg)   SpO2 97%   BMI 22.76 kg/m   BP Readings from Last 3 Encounters:  02/20/23 120/80  11/24/22 (!) 140/74  01/12/22 114/72    Wt Readings from Last 3 Encounters:  02/20/23 158 lb 9.6 oz (71.9 kg)  11/24/22 159 lb 3.2 oz (72.2 kg)  01/12/22 155 lb (70.3 kg)    Physical Exam  Lab Results  Component Value Date   WBC 8.5 08/31/2021   HGB 16.4 08/31/2021   HCT 49.6 08/31/2021   PLT 156.0 08/31/2021   GLUCOSE 93 08/31/2021   CHOL 91 08/31/2021   TRIG 181.0 (H) 08/31/2021  HDL 44.60 08/31/2021   LDLCALC 10 08/31/2021   ALT 22 07/16/2020   AST 21 07/16/2020   NA 140 08/31/2021   K 4.5 08/31/2021   CL 102 08/31/2021   CREATININE 1.26 08/31/2021   BUN 14 08/31/2021   CO2 29 08/31/2021   TSH 2.18 08/31/2021   PSA 0.98 05/22/2017   HGBA1C 5.2 08/31/2021    DG Bone Density  Result Date: 12/28/2022 Table formatting from the original result was not included. Date of study: 12/28/2022 Exam: DUAL X-RAY ABSORPTIOMETRY (DXA) FOR BONE MINERAL DENSITY (BMD) Instrument: [employer] Therapist, art Provider: PCP Indication: screening for osteoporosis in patient on long-term immunosuppressants Comparison: none (please note that it is not  possible to compare data from different instruments) Clinical data: Pt is a 42 y.o. male without previous fractures.  On vitamin D. Results:  Lumbar spine L1-L4 Femoral neck (FN) 33% distal radius Z-score -0.6 RFN: +0.2 LFN: +0.2 n/a Assessment: the BMD is normal according to the [facility] classification for osteoporosis (see below). Fracture risk: low FRAX score: not calculated due to normal BMD Comments: the technical quality of the study is good Recommend optimizing calcium (1200 mg/day) and vitamin D (800 IU/day) intake. No pharmacological treatment is indicated. Followup: Repeat BMD is appropriate after 2-5 years. WHO criteria for diagnosis of osteoporosis in postmenopausal women and in men 67 y/o or older: - normal: T-score -1.0 to + 1.0 - osteopenia/low bone density: T-score between -2.5 and -1.0 - osteoporosis: T-score below -2.5 - severe osteoporosis: T-score below -2.5 with history of fragility fracture Note: although not part of the WHO classification, the presence of a fragility fracture, regardless of the T-score, should be considered diagnostic of osteoporosis, provided other causes for the fracture have been excluded. Z scores are indicated for BMD evaluation in children, premenopausal women, and men younger than 44 years old. Z scores < -2.0 are considered "below the expected range for age" in these populations. Carlus Pavlov, MD [facility]   Assessment & Plan:  There are no diagnoses linked to this encounter.   Follow-up: No follow-ups on file.  Sanda Linger, MD

## 2023-02-20 NOTE — Patient Instructions (Signed)
Health Maintenance, Male Adopting a healthy lifestyle and getting preventive care are important in promoting health and wellness. Ask your health care provider about: The right schedule for you to have regular tests and exams. Things you can do on your own to prevent diseases and keep yourself healthy. What should I know about diet, weight, and exercise? Eat a healthy diet  Eat a diet that includes plenty of vegetables, fruits, low-fat dairy products, and lean protein. Do not eat a lot of foods that are high in solid fats, added sugars, or sodium. Maintain a healthy weight Body mass index (BMI) is a measurement that can be used to identify possible weight problems. It estimates body fat based on height and weight. Your health care provider can help determine your BMI and help you achieve or maintain a healthy weight. Get regular exercise Get regular exercise. This is one of the most important things you can do for your health. Most adults should: Exercise for at least 150 minutes each week. The exercise should increase your heart rate and make you sweat (moderate-intensity exercise). Do strengthening exercises at least twice a week. This is in addition to the moderate-intensity exercise. Spend less time sitting. Even light physical activity can be beneficial. Watch cholesterol and blood lipids Have your blood tested for lipids and cholesterol at 44 years of age, then have this test every 5 years. You may need to have your cholesterol levels checked more often if: Your lipid or cholesterol levels are high. You are older than 44 years of age. You are at high risk for heart disease. What should I know about cancer screening? Many types of cancers can be detected early and may often be prevented. Depending on your health history and family history, you may need to have cancer screening at various ages. This may include screening for: Colorectal cancer. Prostate cancer. Skin cancer. Lung  cancer. What should I know about heart disease, diabetes, and high blood pressure? Blood pressure and heart disease High blood pressure causes heart disease and increases the risk of stroke. This is more likely to develop in people who have high blood pressure readings or are overweight. Talk with your health care provider about your target blood pressure readings. Have your blood pressure checked: Every 3-5 years if you are 18-39 years of age. Every year if you are 40 years old or older. If you are between the ages of 65 and 75 and are a current or former smoker, ask your health care provider if you should have a one-time screening for abdominal aortic aneurysm (AAA). Diabetes Have regular diabetes screenings. This checks your fasting blood sugar level. Have the screening done: Once every three years after age 45 if you are at a normal weight and have a low risk for diabetes. More often and at a younger age if you are overweight or have a high risk for diabetes. What should I know about preventing infection? Hepatitis B If you have a higher risk for hepatitis B, you should be screened for this virus. Talk with your health care provider to find out if you are at risk for hepatitis B infection. Hepatitis C Blood testing is recommended for: Everyone born from 1945 through 1965. Anyone with known risk factors for hepatitis C. Sexually transmitted infections (STIs) You should be screened each year for STIs, including gonorrhea and chlamydia, if: You are sexually active and are younger than 44 years of age. You are older than 44 years of age and your   health care provider tells you that you are at risk for this type of infection. Your sexual activity has changed since you were last screened, and you are at increased risk for chlamydia or gonorrhea. Ask your health care provider if you are at risk. Ask your health care provider about whether you are at high risk for HIV. Your health care provider  may recommend a prescription medicine to help prevent HIV infection. If you choose to take medicine to prevent HIV, you should first get tested for HIV. You should then be tested every 3 months for as long as you are taking the medicine. Follow these instructions at home: Alcohol use Do not drink alcohol if your health care provider tells you not to drink. If you drink alcohol: Limit how much you have to 0-2 drinks a day. Know how much alcohol is in your drink. In the [place] one drink equals one 12 oz bottle of beer (355 mL), one 5 oz glass of wine (148 mL), or one 1 oz glass of hard liquor (44 mL). Lifestyle Do not use any products that contain nicotine or tobacco. These products include cigarettes, chewing tobacco, and vaping devices, such as e-cigarettes. If you need help quitting, ask your health care provider. Do not use street drugs. Do not share needles. Ask your health care provider for help if you need support or information about quitting drugs. General instructions Schedule regular health, dental, and eye exams. Stay current with your vaccines. Tell your health care provider if: You often feel depressed. You have ever been abused or do not feel safe at home. Summary Adopting a healthy lifestyle and getting preventive care are important in promoting health and wellness. Follow your health care provider's instructions about healthy diet, exercising, and getting tested or screened for diseases. Follow your health care provider's instructions on monitoring your cholesterol and blood pressure. This information is not intended to replace advice given to you by your health care provider. Make sure you discuss any questions you have with your health care provider. Document Revised: 07/27/2020 Document Reviewed: 07/27/2020 [employer] Patient Education  2024 [employer]  

## 2023-02-21 DIAGNOSIS — R3121 Asymptomatic microscopic hematuria: Secondary | ICD-10-CM | POA: Diagnosis not present

## 2023-02-21 DIAGNOSIS — R82991 Hypocitraturia: Secondary | ICD-10-CM | POA: Diagnosis not present

## 2023-02-21 DIAGNOSIS — R82992 Hyperoxaluria: Secondary | ICD-10-CM | POA: Diagnosis not present

## 2023-03-01 ENCOUNTER — Ambulatory Visit: Payer: BC Managed Care – PPO | Admitting: Internal Medicine

## 2023-03-03 DIAGNOSIS — Z980 Intestinal bypass and anastomosis status: Secondary | ICD-10-CM | POA: Diagnosis not present

## 2023-03-03 DIAGNOSIS — Z9049 Acquired absence of other specified parts of digestive tract: Secondary | ICD-10-CM | POA: Diagnosis not present

## 2023-03-03 DIAGNOSIS — K562 Volvulus: Secondary | ICD-10-CM | POA: Diagnosis not present

## 2023-03-03 DIAGNOSIS — Z888 Allergy status to other drugs, medicaments and biological substances status: Secondary | ICD-10-CM | POA: Diagnosis not present

## 2023-03-03 DIAGNOSIS — Q438 Other specified congenital malformations of intestine: Secondary | ICD-10-CM | POA: Diagnosis not present

## 2023-03-03 DIAGNOSIS — Z8719 Personal history of other diseases of the digestive system: Secondary | ICD-10-CM | POA: Diagnosis not present

## 2023-03-03 DIAGNOSIS — Z1211 Encounter for screening for malignant neoplasm of colon: Secondary | ICD-10-CM | POA: Diagnosis not present

## 2023-03-03 DIAGNOSIS — Z886 Allergy status to analgesic agent status: Secondary | ICD-10-CM | POA: Diagnosis not present

## 2023-03-03 DIAGNOSIS — K5090 Crohn's disease, unspecified, without complications: Secondary | ICD-10-CM | POA: Diagnosis not present

## 2023-03-03 DIAGNOSIS — I4891 Unspecified atrial fibrillation: Secondary | ICD-10-CM | POA: Diagnosis not present

## 2023-03-20 DIAGNOSIS — K5090 Crohn's disease, unspecified, without complications: Secondary | ICD-10-CM | POA: Diagnosis not present

## 2023-03-31 DIAGNOSIS — K5090 Crohn's disease, unspecified, without complications: Secondary | ICD-10-CM | POA: Diagnosis not present

## 2023-04-11 ENCOUNTER — Encounter: Payer: Self-pay | Admitting: Pediatrics

## 2023-04-13 DIAGNOSIS — R82991 Hypocitraturia: Secondary | ICD-10-CM | POA: Diagnosis not present

## 2023-04-13 DIAGNOSIS — R82992 Hyperoxaluria: Secondary | ICD-10-CM | POA: Diagnosis not present

## 2023-05-24 DIAGNOSIS — K5090 Crohn's disease, unspecified, without complications: Secondary | ICD-10-CM | POA: Diagnosis not present

## 2023-06-02 DIAGNOSIS — K5090 Crohn's disease, unspecified, without complications: Secondary | ICD-10-CM | POA: Diagnosis not present

## 2023-06-19 NOTE — Progress Notes (Unsigned)
 [facility] Return Visit   Referring Provider Etta Grandchild, MD [address] [place],  [place] [address]  Primary Care Provider Etta Grandchild, MD  Patient Profile: Jeffrey Schank. is a 43 y.o. male who returns to the [facility] for follow-up of the problem(s) noted below.  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 History of cholelithiasis  History of nephrolithiasis-calcium oxalate in nature likely related to intestinal resection   History of Present Illness   Jeffrey Reilly was last seen by me at [facility] gastroenterology 01/11/2023   Current GI Meds  Infliximab 5 mg/kg every 8 weeks [facility] [phone])   Interval History    Last colonoscopy:  02/2023 -endoscopically normal colon and terminal ileum-patent ileal ileal anastomosis without inflammation Last endoscopy:  07/2012 - normal  Last Abd CT/CTE/MRE:  12/2020 - roughly 40 cm of small bowel demonstrating mild epithelial hyperenhancement suggesting mild acute on chronic Crohn's located just proximal to the patient's anastomosis. There is associated fibrofatty proliferation and prominence of the vasa recta.   GI Review of Symptoms Significant for {GIROS:50592}. Otherwise negative.  General Review of Systems  Review of systems is significant for the pertinent positives and negatives as listed per the HPI.  Full ROS is otherwise negative.   Inflammatory Bowel Disease History  - 1994 - Diagnosed with small bowel Crohn's disease that he managed with complementary therapy and diet - 03/2012 - Small bowel volvulus secondary to internal hernia status post resection of 194 cm of mid small bowel (3 surgeries) - required ileostomy, TPN; Tx'd w/azathioprine -->  developed pancreatitis/hepatitis in [place]  - 07/2012 - EGD/Colonoscopy/Ileoscopy -mild to moderate erythema and friability in the colon, nl EGD, Ileum with moderate stenosis but no inflammation - 10/2012 - Ileostomy takedown - 11/2012 - Induction IFX 5 mg/kg Q8 weeks - 12/2017 - Colonoscopy - normal colon and TI - 12/2020 - MRE -mild epithelial hyperenhancement suggesting mild acute on chronic Crohn's disease located just proximal to the anastomosis - 02/2023 - Colonoscopy - normal colon and TI  IBD Medication History Complementary therapies -used for the first 2 decades of diagnosis Azathioprine-resulted in pancreatitis/hepatitis 2014 Infliximab-initiated in 2014 with adequate response   Past Medical History   Past Medical History:  Diagnosis Date   Crohn's disease of both small and large intestine with intestinal obstruction (HCC) 03/2012   treated in [place]   GERD (gastroesophageal reflux disease)    History of kidney stones      Past Surgical History   Past Surgical History:  Procedure Laterality Date   CYSTOSCOPY WITH RETROGRADE PYELOGRAM, URETEROSCOPY AND STENT PLACEMENT Right 08/18/2017   Procedure: CYSTOSCOPY WITH RETROGRADE PYELOGRAM, URETEROSCOPY AND STENT PLACEMENT;  Surgeon: Sebastian Ache, MD;  Location: [facility];  Service: Urology;  Laterality: Right;  45 MINS   HERNIA REPAIR     incisional    HOLMIUM LASER APPLICATION Right 08/18/2017   Procedure: HOLMIUM LASER APPLICATION;  Surgeon: Sebastian Ache, MD;  Location: [facility];  Service: Urology;  Laterality: Right;   SMALL INTESTINE SURGERY  03/2012   190 cm of small intestine removed     Allergies and Medications   Allergies  Allergen Reactions   Azathioprine     swelling   Acetaminophen Other (See Comments)    Headache discomfort   Chlorhexidine Rash    @  ZOXWRUEAV@  Family History   Family History  Problem Relation Age of Onset   Diabetes Father    Cancer Other        Breast and Prostate Cancer    Alcohol abuse Neg Hx    Heart disease Neg Hx    Hyperlipidemia Neg Hx    Hypertension Neg Hx    Kidney disease Neg Hx    Stroke Neg Hx    Early death Neg Hx    GI Specific Family History: {gifamhx:50061}   Social History   Social History   Tobacco Use   Smoking status: Never   Smokeless tobacco: Never   Tobacco comments:    Never smoked 11/24/22  Vaping Use   Vaping status: Never Used  Substance Use Topics   Alcohol use: Yes    Alcohol/week: 2.0 standard drinks of alcohol    Types: 1 Cans of beer, 1 Standard drinks or equivalent per week    Comment: rarely    Drug use: No   Halo reports that he has never smoked. He has never used smokeless tobacco. He reports current alcohol use of about 2.0 standard drinks of alcohol per week. He reports that he does not use drugs.  Vital Signs and Physical Examination  There were no vitals filed for this visit. There is no height or weight on file to calculate BMI.    General: Well developed, well nourished, no acute distress Head: Normocephalic and atraumatic Eyes: Sclerae anicteric, EOMI Ears: Normal auditory acuity Mouth: No deformities or lesions noted Lungs: Clear throughout to auscultation Heart: Regular rate and rhythm; No murmurs, rubs or bruits Abdomen: Soft, non tender and non distended. No masses, hepatosplenomegaly or hernias noted. Normal Bowel sounds Rectal: Musculoskeletal: Symmetrical with no gross deformities  Pulses:  Normal pulses noted Extremities: No edema or deformities noted Neurological: Alert oriented x 4, grossly nonfocal Psychological:  Alert and cooperative. Normal mood and affect   Review of Data  The following data was reviewed at the time of this encounter:  Laboratory Studies      Latest Ref Rng & Units 10/10/2022   12:00 AM 08/31/2021    1:55 PM 07/16/2020    9:25 AM  CBC  WBC  8.3     8.5  8.4   Hemoglobin 13.5 - 17.5 17.1     16.4  17.0   Hematocrit 41 - 53 52     49.6  50.7    Platelets 150 - 400 K/uL 186     156.0  172.0      This result is from an external source.    Lab Results  Component Value Date   LIPASE 33.0 07/16/2020      Latest Ref Rng & Units 02/20/2023    8:45 AM 10/10/2022   12:00 AM 08/31/2021    1:55 PM  CMP  Glucose 70 - 99 mg/dL 94   93   BUN 6 - 23 mg/dL 17  11     14    Creatinine 0.40 - 1.50 mg/dL 4.09  1.3     8.11   Sodium 135 - 145 mEq/L 140  141     140   Potassium 3.5 - 5.1 mEq/L 4.7  4.7     4.5   Chloride 96 - 112 mEq/L 103  101     102   CO2 19 - 32 mEq/L 30  24     29    Calcium 8.4 - 10.5 mg/dL 9.6  91.4  9.7   Alkaline Phos 25 - 125  55       AST 14 - 40  24       ALT 10 - 40 U/L  16          This result is from an external source.   IBD Labs  Prebiologic Labs 2024 -hepatitis B surface antigen negative, hepatitis B core antibody total negative, hepatitis B surface antibody nonreactive, QuantiFERON gold negative   Therapeutic Drug Monitoring @LAST[facility] (TPMTACT)@ Thiopurine metabolite levels:  Date:                6-TGN       6-MMP  Biologic level and antibodies:  Fecal Calprotectin @[facility] (FECALCAL)@   Imaging Studies  DEXA 12/28/2022  Normal  MRE 12/2020 Roughly 40 cm of small bowel demonstrating mild epithelial hyperenhancement suggesting  mild acute on chronic Crohn's located just proximal to the patient's anastomosis.  There is associated fibrofatty proliferation and prominence of the vasa recta  CTAP 11/2012 Improved appearance of a right lower quadrant abscess which is now significantly smaller in size containing a small amount of air and fluid. No evidence of drainable collection.  CTAP 10/2012 1. Gas containing fluid collection in the right lower quadrant, suspicious for abscess. The collection measures 4.8 cm in length but only 1.7 cm in AP dimension and is likely too small for percutaneous drainage at this time. 2. Marked small bowel wall thickening and inflammatory change, consistent with  active Crohn's disease. No definite fistula visualized. No bowel obstruction.  CT abdomen/pelvis 05/08/12  Mild stranding adjacent to the head of the pancreas and retroperitoneal fluid which may represent pancreatitis.  No peri-pancreatic fluid collections, parenchymal necrosis or other complication. Small ascites.  Persistent enlargement and mottled enhancement pattern of the liver with an large hepatic veins.   CT abdomen/pelvis 04/27/12  Multiple short-segment areas of luminal narrowing within the mid to distal remaining small bowel. Most of these have the appearance of developing fibrous stenotic strictures, although at least 1 area is suggestive of adhesive band causing extrinsic narrowing as described. This is superimposed on diffuse mucosal enhancement that likely represents at least mild active inflammatory component. Moderate peripheral enhancement pattern to the liver within large hepatic veins compatible with hepatic venous outflow compromise without thrombus or mass.   CT abdomen/pelvis 04/04/12  High-grade distal small-bowel obstruction with CT findings of small bowel compromise. The etiology of the distal small bowel obstruction appears to be volvulus, potentially involving an internal hernia with associated venous mesenteric congestion and a moderate amount of ascites.   CTAP 09/1994 Rad Rpt: Final 10/13/1994 12:58 Req# [id number] Acct# [id number] CT BODY/INTERP OUTSIDE FILMS   10/13/94  INTERPRETATION OF OUTSIDE CT SCAN OF THE ABDOMEN AND PELVIS FROM THE [facility] IN [place], [place] DATED September 16, 1994.  CLINICAL SUMMARY: A FIFTEEN-YEAR-OLD-WHITE MALE WITH CHRONIC ABDOMINAL PAIN.  TECHNIQUE: SIX SHEETS OF FILM WERE SUBMITTED FOR EVALUATION INCLUDING MULTIPLE AXIAL IMAGES OF THE ABDOMEN AND PELVIS FROM THE LOWER LUNG FIELDS TO THE SYMPHYSIS PUBIS. BOTH INTRAVENOUS AND ORAL CONTRAST MATERIAL HAS BEEN ADMINISTERED.  FINDINGS: IMAGES THROUGH THE LOWER LUNG FIELDS ARE  NORMAL. THE LIVER IS OVERALL NORMAL IN SIZE AND DENSITY WITHOUT FOCAL LESIONS. THE SPLEEN IS ALSO NORMAL. THE PANCREAS AND ADRENALS AND KIDNEYS ARE NORMAL. THERE ARE NUMEROUS MESENTERIC AND RETROPERITONEAL LYMPH NODES. THE LARGEST MESENTERIC LYMPH NODES ARE ON THE ORDER OF 12 MM IN DIAMETER. THE RETROPERITONEAL LYMPH NODES MEASURE UP TO 8 MM IN DIAMETER. THERE  ARE NUMEROUS SMALL BOWEL LOOPS LOCATED PRIMARILY IN THE LEFT ABDOMEN WHICH DEMONSTRATE CIRCUMFERENTIAL THICKENING OF THE WALL AND HAZINESS IN THE PERIENTERIC FAT. THIS IS NOTED FOR A LONG SEGMENT OF SMALL BOWEL ENCOMPASSING THE JEJUNUM AND ILEUM. THE MOST PROXIMAL JEJUNAL LOOPS APPEAR RELATIVELY NORMAL. THERE ARE ALSO APPEARS TO BE SOME TERMINAL ILEAL THICKENING ALTHOUGH THERE IS EVIDENCE FOR MORE NORMAL SMALL BOWEL IN BETWEEN THE PROXIMAL AND TERMINAL ILEUM. THERE IS NO SIGNIFICANT EVIDENCE FOR SMALL OBSTRUCTION AT THIS TIME. IN ADDITION, THERE IS A SOMEWHAT ENCAPSULATED FLUID COLLECTION LOCATED JUST CAUDAL TO THE HEPATIC FLEXURE AND ADJACENT TO SEVERAL THICKENED LOOPS OF SMALL BOWEL. THE WALL OF THE FLUID COLLECTION IS SOMEWHAT THICK AND IRREGULAR AND THE ENTIRE COLLECTION IS ON THE ORDER OF 3.5 CM TRANSVERSE X 5.0 CM AP. A SMALL AMOUNT OF INTRAPERITONEAL FLUID IS NOTED IN THE PELVIS AS WELL. THE VISUALIZED BONY STRUCTURES ARE GROSSLY NORMAL.  IMPRESSION:  1. NUMEROUS THICKENED AND INFLAMED SMALL BOWEL LOOPS INVOLVING THE DISTAL JEJUNAL AND PROXIMAL ILEUM. THERE IS ALSO THICKENING OF TERMINAL ILEAL LOOPS ALTHOUGH THERE APPEARS TO BE INTERPOSED NORMAL SMALL BOWEL IN THE MID-ILEUM.  2. NUMEROUS SMALL-TO-MILDLY ENLARGED LYMPH NODES IN THE RETROPERITONEUM AND ESPECIALLY THE MESENTERY.  3. THIS COMBINATION OF FINDINGS IS MOSTLY SUGGESTIVE OF INFLAMMATORY BOWEL DISEASE SUCH AS CROHN'S DISEASE AND LYMPHOMA IN THIS SETTING IS FELT TO BE LESS LIKELY.  4. 3.5 X 5.0 CM THICK-WALLED FLUID COLLECTION IN THE REGION OF THE SPLENIC  FLEXURE ADJACENT TO THICKENED SMALL BOWEL LOOPS. THIS COLLECTION IS SUSPICIOUS FOR AN ABSCESS.   GI Procedures and Studies  Colonoscopy 03/03/2023 - Normal mucosa in the entire examined colon. Biopsied. - Tortuous colon. - Patent ileoileal surgical anastomosis in the TI, characterized by healthy appearing mucosa. Biopsied. - The distal rectum and anal verge are normal on retroflexion view.   Path: normal  Colonoscopy 01/05/2018 Endoscopically and histologically normal colon and terminal ileum  08/10/2012 EGD - normal Ileoscopy -moderate stenosis-no inflammation Colonoscopy -patchy mild to moderate erythema and contact friability scattered throughout the colon suggestive of diversion colitis  Colonoscopy Pathology 08/1994 MICROSCOPIC EXAMINATION: All of the biopsies are completely normal with the exception of the ileal biopsy in which one of three fragments shows a mild increase in plasma cells and lymphocytes. Scattered eosinophils are also present. An occasional neutrophil is seen in the lamina propria and surface epithelium. No ulceration is noted.  DIAGNOSIS: 1. (OUTSIDE SLIDE REVIEW, [phone], [facility] [facility] [facility], [place], [place], DATE OF PROCEDURE 09/13/94):  A. "ILEAL ULCERS, BIOPSY":  SMALL INTESTINAL MUCOSA WITH MILD CHRONIC INFLAMMATION. SEE COMMENT. B. "RIGHT COLON, RANDOM BIOPSIES":  NORMAL COLONIC MUCOSA.  C. "MID COLON, RANDOM BIOPSIES":  COLONIC MUCOSA, NO PATHOLOGIC DIAGNOSIS.  D. "COLON (LABELED SUBMUCOSAL MARGIN) BIOPSIES):  COLONIC MUCOSA, NO PATHOLOGIC DIAGNOSIS. SEE COMMENT.  E. "LEFT COLON RANDOM BIOPSIES":  COLONIC MUCOSA, NO PATHOLOGIC DIAGNOSIS.  COMMENT: The ileal biopsy does not show an ulcer. Ulcers in the ileum can be due to a number of causes including drugs and Crohn's disease ,for which there is no other evidence in this biopsy. There are a few neutrophils in the surface but these could be operative.  As noted in the outside  report, the biopsy of the submucosal nodule in biopsy D is too superficial for evaluation of a submucosal nodule.     Clinical Impression  It is my clinical impression that Jeffrey Reilly is a 43 y.o. 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 He managed his condition with complementary therapies and diet for 2 decades.   In 03/2012 while traveling in [place] he developed a small bowel volvulus secondary to a internal hernia resulting in resection of 194 cm of small bowel over the course of 3 surgeries. He required an ileostomy and TPN. Postoperatively he was initially managed with azathioprine which resulted in pancreatitis and hepatitis for which the medication was ceased. He ultimately underwent ileostomy takedown 10/2012. In 11/2012 he initiated treatment with infliximab 5 mg/kg Q 8 weeks to which he has responded well over the years. At the time of medication initiation we discussed that he lost a significant amount of small bowel during his volvulus surgery and it would be critical to protect the remaining bowel from active Crohn's disease.  At today's visit he reports generally feeling well from a symptom perspective. MRE in 2022 commented on possible acute on chronic inflammation proximal to his anastomosis. Restaging and surveillance colonoscopy is scheduled for December 2024. We discussed adding nutritional labs and an infliximab level to his upcoming infusion labs in November 2024.   Plan  *** *** *** *** ***   IBD Health Maintenance  Vaccinations Influenza: 2024 PCV13: PPSV23: COVID19: HAV/HBV:HBV  non-immune - recommended vaccination 12/2022  Shingles: Recommended 12/2022 HPV:  DEXA 12/2022 - normal; repeat prn  Pap Smear   Eye Exam   Skin Exam 12/2022 - recommended skin exam  Surveillance Colonoscopy 02/2023 - normal; repeat 2029  Tobacco Use   Depression Screen     Planned Follow Up No follow-ups on file.  The patient or caregiver verbalized understanding of the material covered, with no barriers to understanding. All questions were answered. Patient or caregiver is agreeable with the plan outlined above.    It was a pleasure to see Jeffrey Reilly.  If you have any questions or concerns regarding this evaluation, do not hesitate to contact me.  Maren Beach, MD [facility]

## 2023-06-20 ENCOUNTER — Encounter: Payer: Self-pay | Admitting: Pediatrics

## 2023-06-20 ENCOUNTER — Ambulatory Visit (INDEPENDENT_AMBULATORY_CARE_PROVIDER_SITE_OTHER): Payer: BC Managed Care – PPO | Admitting: Pediatrics

## 2023-06-20 VITALS — BP 118/72 | HR 73 | Ht 70.0 in | Wt 159.0 lb

## 2023-06-20 DIAGNOSIS — K50018 Crohn's disease of small intestine with other complication: Secondary | ICD-10-CM

## 2023-06-20 DIAGNOSIS — K759 Inflammatory liver disease, unspecified: Secondary | ICD-10-CM | POA: Diagnosis not present

## 2023-06-20 DIAGNOSIS — K859 Acute pancreatitis without necrosis or infection, unspecified: Secondary | ICD-10-CM

## 2023-06-20 DIAGNOSIS — T451X5A Adverse effect of antineoplastic and immunosuppressive drugs, initial encounter: Secondary | ICD-10-CM

## 2023-06-20 DIAGNOSIS — Z7960 Long term (current) use of unspecified immunomodulators and immunosuppressants: Secondary | ICD-10-CM

## 2023-06-20 NOTE — Patient Instructions (Signed)
 Follow up in 6 months.  Thank you for entrusting me with your care and for choosing [facility], Dr. Maren Beach   _______________________________________________________  If your blood pressure at your visit was 140/90 or greater, please contact your primary care physician to follow up on this.  _______________________________________________________  If you are age 45 or older, your body mass index should be between 23-30. Your Body mass index is 22.81 kg/m. If this is out of the aforementioned range listed, please consider follow up with your Primary Care Provider.  If you are age 65 or younger, your body mass index should be between 19-25. Your Body mass index is 22.81 kg/m. If this is out of the aformentioned range listed, please consider follow up with your Primary Care Provider.   ________________________________________________________  The [facility] providers would like to encourage you to use [facility] to communicate with providers for non-urgent requests or questions.  Due to long hold times on the telephone, sending your provider a message by [facility] may be a faster and more efficient way to get a response.  Please allow 48 business hours for a response.  Please remember that this is for non-urgent requests.  _______________________________________________________

## 2023-06-23 ENCOUNTER — Telehealth: Payer: Self-pay

## 2023-06-23 ENCOUNTER — Encounter: Payer: Self-pay | Admitting: Pediatrics

## 2023-06-23 DIAGNOSIS — K50018 Crohn's disease of small intestine with other complication: Secondary | ICD-10-CM | POA: Insufficient documentation

## 2023-06-23 NOTE — Telephone Encounter (Signed)
-----   Message from Ottie Glazier sent at 06/22/2023  9:59 PM EDT ----- Regarding: Transitioning Avsola infusion Hi Nurses -  I am touching base regarding Jeffrey Reilly transitioning his Avsola infusions for Crohn's disease from [facility] to [facility] infusion center.  He is a patient I formally followed at [facility] and is now at [facility].  He receives Avsola 5 mg/kg every 8 weeks at [facility].  He is due for his next dose of medication the week of Aug 02, 2023.  Please place a new referral for Avsola 5 mg/kg every 8 weeks at [facility] infusion center Please order the following labs quarterly for immune suppressant monitoring: CBC, CMP, ESR, CRP With his first infusion at [facility] please also order infliximab level and antiinfliximab antibody.   I just saw him in clinic this week and documented everything in my clinic note which can be used for his infusion referral.  Please let me know if there are any questions about transitioning his infusions.  Thanks,  Maren Beach

## 2023-06-23 NOTE — Telephone Encounter (Signed)
 Avsola therapy plan is being entered in epic with the assistance of Desma Mcgregor - they are able to draw labs as requested.

## 2023-06-23 NOTE — Telephone Encounter (Signed)
 Avsola orders and lab orders entered in therapy plan. Once Infusion center has insurance approval they will contact patient to schedule.

## 2023-06-23 NOTE — Addendum Note (Signed)
 Addended by: Marisa Sprinkles on: 06/23/2023 10:15 AM   Modules accepted: Orders

## 2023-06-27 ENCOUNTER — Telehealth: Payer: Self-pay

## 2023-06-27 NOTE — Telephone Encounter (Signed)
 Dr. Doy Hutching and [place], patient will be scheduled as soon as possible.   Auth Submission: APPROVED Site of care: Site of care: [facility] Payer: [employer] commercial Medication & CPT/J Code(s) submitted: Avsola (infliximab-axxq) [phone] Route of submission (phone, fax, portal): fax Phone # Fax # [phone]  Auth type: Buy/Bill PB Units/visits requested: 5mg /kg every 8 weeks x 6 doses Reference number: [id number] Approval from: 06/26/23 to 06/25/24

## 2023-06-28 NOTE — Telephone Encounter (Signed)
 Dr. Doy Hutching and [place], patient will be scheduled as soon as possible.   Auth Submission: APPROVED Site of care: Site of care: [facility] Payer: [employer] commercial Medication & CPT/J Code(s) submitted: Avsola (infliximab-axxq) [phone] Route of submission (phone, fax, portal): fax Phone # Fax # [phone]  Auth type: Buy/Bill PB Units/visits requested: 5mg /kg every 8 weeks x 6 doses Reference number: [id number] Approval from: 06/26/23 to 06/25/24

## 2023-06-28 NOTE — Telephone Encounter (Signed)
 Avsola infusion scheduled for 08/02/23

## 2023-08-02 ENCOUNTER — Ambulatory Visit (INDEPENDENT_AMBULATORY_CARE_PROVIDER_SITE_OTHER)

## 2023-08-02 VITALS — BP 137/81 | HR 65 | Temp 97.6°F | Resp 16 | Ht 70.0 in | Wt 162.0 lb

## 2023-08-02 DIAGNOSIS — K50018 Crohn's disease of small intestine with other complication: Secondary | ICD-10-CM

## 2023-08-02 MED ORDER — SODIUM CHLORIDE 0.9 % IV SOLN
5.0000 mg/kg | INTRAVENOUS | Status: DC
  Administered 2023-08-02: 400 mg via INTRAVENOUS
  Filled 2023-08-02: qty 40

## 2023-08-02 NOTE — Progress Notes (Signed)
 Diagnosis: Crohn's Disease  Provider:  Praveen Mannam MD  Procedure: IV Infusion  IV Type: Peripheral, IV Location: R Antecubital  Avsola  (infliximab -axxq), Dose: 400 mg  Infusion Start Time: 0942  Infusion Stop Time: 1158  Post Infusion IV Care: Patient declined observation PIV Discontinued  Discharge: Condition: Good, Destination: Home . AVS Declined  Performed by:  Natividad Balding, RN

## 2023-10-03 ENCOUNTER — Ambulatory Visit

## 2023-10-03 MED ORDER — METHYLPREDNISOLONE SODIUM SUCC 40 MG IJ SOLR: 40.0000 mg | Freq: Once | INTRAMUSCULAR | Status: DC

## 2023-10-03 MED ORDER — ACETAMINOPHEN 325 MG PO TABS: 650.0000 mg | ORAL_TABLET | Freq: Once | ORAL | Status: DC

## 2023-10-03 MED ORDER — DIPHENHYDRAMINE HCL 25 MG PO CAPS: 25.0000 mg | ORAL_CAPSULE | Freq: Once | ORAL | Status: DC

## 2023-10-12 ENCOUNTER — Other Ambulatory Visit: Payer: Self-pay

## 2023-10-12 ENCOUNTER — Ambulatory Visit (INDEPENDENT_AMBULATORY_CARE_PROVIDER_SITE_OTHER)

## 2023-10-12 VITALS — BP 137/86 | HR 66 | Temp 98.0°F | Resp 16 | Ht 70.0 in | Wt 158.0 lb

## 2023-10-12 DIAGNOSIS — K50018 Crohn's disease of small intestine with other complication: Secondary | ICD-10-CM

## 2023-10-12 DIAGNOSIS — Z79620 Long term (current) use of immunosuppressive biologic: Secondary | ICD-10-CM

## 2023-10-12 MED ORDER — SODIUM CHLORIDE 0.9 % IV SOLN
5.0000 mg/kg | INTRAVENOUS | Status: DC
  Administered 2023-10-12: 400 mg via INTRAVENOUS
  Filled 2023-10-12: qty 40

## 2023-10-12 MED ORDER — ACETAMINOPHEN 325 MG PO TABS
650.0000 mg | ORAL_TABLET | Freq: Once | ORAL | Status: DC
Start: 2023-10-12 — End: 2023-10-12

## 2023-10-12 MED ORDER — DIPHENHYDRAMINE HCL 25 MG PO CAPS: 25.0000 mg | ORAL_CAPSULE | Freq: Once | ORAL | Status: DC

## 2023-10-12 MED ORDER — METHYLPREDNISOLONE SODIUM SUCC 40 MG IJ SOLR: 40.0000 mg | Freq: Once | INTRAMUSCULAR | Status: DC

## 2023-10-12 NOTE — Progress Notes (Signed)
 Diagnosis: Crohn's Disease  Provider:  Praveen Mannam MD  Procedure: IV Infusion  IV Type: Peripheral, IV Location: R Antecubital  Avsola  (infliximab -axxq), Dose: 400 mg  Infusion Start Time: 0946  Infusion Stop Time: 1203  Post Infusion IV Care: Peripheral IV Discontinued  Discharge: Condition: Good, Destination: Home . AVS Declined  Performed by:  Maximiano JONELLE Pouch, LPN

## 2023-10-13 ENCOUNTER — Ambulatory Visit: Payer: Self-pay | Admitting: Pediatrics

## 2023-10-19 LAB — CBC WITH DIFFERENTIAL/PLATELET
Absolute Lymphocytes: 3758 {cells}/uL (ref 850–3900)
Absolute Monocytes: 466 {cells}/uL (ref 200–950)
Basophils Absolute: 18 {cells}/uL (ref 0–200)
Basophils Relative: 0.2 %
Eosinophils Absolute: 299 {cells}/uL (ref 15–500)
Eosinophils Relative: 3.4 %
HCT: 53 % — ABNORMAL HIGH (ref 38.5–50.0)
Hemoglobin: 17.1 g/dL (ref 13.2–17.1)
MCH: 29.5 pg (ref 27.0–33.0)
MCHC: 32.3 g/dL (ref 32.0–36.0)
MCV: 91.4 fL (ref 80.0–100.0)
MPV: 10.9 fL (ref 7.5–12.5)
Monocytes Relative: 5.3 %
Neutro Abs: 4259 {cells}/uL (ref 1500–7800)
Neutrophils Relative %: 48.4 %
Platelets: 196 Thousand/uL (ref 140–400)
RBC: 5.8 Million/uL (ref 4.20–5.80)
RDW: 12.4 % (ref 11.0–15.0)
Total Lymphocyte: 42.7 %
WBC: 8.8 Thousand/uL (ref 3.8–10.8)

## 2023-10-19 LAB — COMPREHENSIVE METABOLIC PANEL WITH GFR
AG Ratio: 1.5 (calc) (ref 1.0–2.5)
ALT: 34 U/L (ref 9–46)
AST: 26 U/L (ref 10–40)
Albumin: 4.3 g/dL (ref 3.6–5.1)
Alkaline phosphatase (APISO): 52 U/L (ref 36–130)
BUN: 16 mg/dL (ref 7–25)
CO2: 27 mmol/L (ref 20–32)
Calcium: 9.4 mg/dL (ref 8.6–10.3)
Chloride: 102 mmol/L (ref 98–110)
Creat: 1.18 mg/dL (ref 0.60–1.29)
Globulin: 2.8 g/dL (ref 1.9–3.7)
Glucose, Bld: 127 mg/dL — ABNORMAL HIGH (ref 65–99)
Potassium: 4.2 mmol/L (ref 3.5–5.3)
Sodium: 139 mmol/L (ref 135–146)
Total Bilirubin: 1 mg/dL (ref 0.2–1.2)
Total Protein: 7.1 g/dL (ref 6.1–8.1)
eGFR: 78 mL/min/1.73m2 (ref >=60)

## 2023-10-19 LAB — INFLIXIMAB LEVEL AND ADA FOR IBD
Infliximab ADA, IBD: <10 [AU] (ref <10)
Infliximab Level, IBD: 8.6 ug/mL

## 2023-10-19 LAB — SEDIMENTATION RATE: Sed Rate: 9 mm/h (ref 0–15)

## 2023-10-19 LAB — C-REACTIVE PROTEIN: CRP: <3 mg/L (ref <8.0)

## 2023-12-07 ENCOUNTER — Ambulatory Visit (INDEPENDENT_AMBULATORY_CARE_PROVIDER_SITE_OTHER)

## 2023-12-07 VITALS — BP 133/91 | HR 66 | Temp 97.9°F | Resp 16 | Ht 70.0 in | Wt 159.8 lb

## 2023-12-07 DIAGNOSIS — K50018 Crohn's disease of small intestine with other complication: Secondary | ICD-10-CM | POA: Diagnosis not present

## 2023-12-07 MED ORDER — METHYLPREDNISOLONE SODIUM SUCC 40 MG IJ SOLR: 40.0000 mg | Freq: Once | INTRAMUSCULAR | Status: DC

## 2023-12-07 MED ORDER — SODIUM CHLORIDE 0.9 % IV SOLN
5.0000 mg/kg | INTRAVENOUS | Status: DC
  Administered 2023-12-07: 400 mg via INTRAVENOUS
  Filled 2023-12-07: qty 40

## 2023-12-07 MED ORDER — ACETAMINOPHEN 325 MG PO TABS: 650.0000 mg | ORAL_TABLET | Freq: Once | ORAL | Status: DC

## 2023-12-07 MED ORDER — DIPHENHYDRAMINE HCL 25 MG PO CAPS: 25.0000 mg | ORAL_CAPSULE | Freq: Once | ORAL | Status: DC

## 2023-12-07 NOTE — Progress Notes (Signed)
 Diagnosis: Crohn's Disease  Provider:  Praveen Mannam MD  Procedure: IV Infusion  IV Type: Peripheral, IV Location: L Forearm  Avsola  (infliximab -axxq), Dose: 400 mg  Infusion Start Time: 0942 am  Infusion Stop Time: 1155 am  Post Infusion IV Care: Observation period completed and Peripheral IV Discontinued  Discharge: Condition: Good, Destination: Home . AVS Declined  Performed by:  Leita FORBES Miles, LPN

## 2023-12-09 ENCOUNTER — Telehealth: Admitting: Family Medicine

## 2023-12-09 DIAGNOSIS — R5383 Other fatigue: Secondary | ICD-10-CM | POA: Diagnosis not present

## 2023-12-09 NOTE — Patient Instructions (Signed)
 Fatigue If you have fatigue, you feel tired all the time and have a lack of energy or a lack of motivation. Fatigue may make it difficult to start or complete tasks because of exhaustion. Occasional or mild fatigue is often a normal response to activity or life. However, long-term (chronic) or extreme fatigue may be a symptom of a medical condition such as: Depression. Not having enough red blood cells or hemoglobin in the blood (anemia). A problem with a small gland located in the lower front part of the neck (thyroid disorder). Rheumatologic conditions. These are problems related to the body's defense system (immune system). Infections, especially certain viral infections. Fatigue can also lead to negative health outcomes over time. Follow these instructions at home: Medicines Take over-the-counter and prescription medicines only as told by your health care provider. Take a multivitamin if told by your health care provider. Do not use herbal or dietary supplements unless they are approved by your health care provider. Eating and drinking  Avoid heavy meals in the evening. Eat a well-balanced diet, which includes lean proteins, whole grains, plenty of fruits and vegetables, and low-fat dairy products. Avoid eating or drinking too many products with caffeine in them. Avoid alcohol. Drink enough fluid to keep your urine pale yellow. Activity  Exercise regularly, as told by your health care provider. Use or practice techniques to help you relax, such as yoga, tai chi, meditation, or massage therapy. Lifestyle Change situations that cause you stress. Try to keep your work and personal schedules in balance. Do not use recreational or illegal drugs. General instructions Monitor your fatigue for any changes. Go to bed and get up at the same time every day. Avoid fatigue by pacing yourself during the day and getting enough sleep at night. Maintain a healthy weight. Contact a health care  provider if: Your fatigue does not get better. You have a fever. You suddenly lose or gain weight. You have headaches. You have trouble falling asleep or sleeping through the night. You feel angry, guilty, anxious, or sad. You have swelling in your legs or another part of your body. Get help right away if: You feel confused, feel like you might faint, or faint. Your vision is blurry or you have a severe headache. You have severe pain in your abdomen, your back, or the area between your waist and hips (pelvis). You have chest pain, shortness of breath, or an irregular or fast heartbeat. You are unable to urinate, or you urinate less than normal. You have abnormal bleeding from the rectum, nose, lungs, nipples, or, if you are male, the vagina. You vomit blood. You have thoughts about hurting yourself or others. These symptoms may be an emergency. Get help right away. Call 911. Do not wait to see if the symptoms will go away. Do not drive yourself to the hospital. Get help right away if you feel like you may hurt yourself or others, or have thoughts about taking your own life. Go to your nearest emergency room or: Call 911. Call the National Suicide Prevention Lifeline at (262)721-8699 or 988. This is open 24 hours a day. Text the Crisis Text Line at 8450584327. Summary If you have fatigue, you feel tired all the time and have a lack of energy or a lack of motivation. Fatigue may make it difficult to start or complete tasks because of exhaustion. Long-term (chronic) or extreme fatigue may be a symptom of a medical condition. Exercise regularly, as told by your health care provider.  Change situations that cause you stress. Try to keep your work and personal schedules in balance. This information is not intended to replace advice given to you by your health care provider. Make sure you discuss any questions you have with your health care provider. Document Revised: 12/28/2020 Document  Reviewed: 12/28/2020 Elsevier Patient Education  2024 [employer].

## 2023-12-09 NOTE — Progress Notes (Signed)
 Virtual Visit Consent   Jeffrey W Fitz Jr., you are scheduled for a virtual visit with a [facility] provider today. Just as with appointments in the office, your consent must be obtained to participate. Your consent will be active for this visit and any virtual visit you may have with one of our providers in the next 365 days. If you have a MyChart account, a copy of this consent can be sent to you electronically.  As this is a virtual visit, video technology does not allow for your provider to perform a traditional examination. This may limit your provider's ability to fully assess your condition. If your provider identifies any concerns that need to be evaluated in person or the need to arrange testing (such as labs, EKG, etc.), we will make arrangements to do so. Although advances in technology are sophisticated, we cannot ensure that it will always work on either your end or our end. If the connection with a video visit is poor, the visit may have to be switched to a telephone visit. With either a video or telephone visit, we are not always able to ensure that we have a secure connection.  By engaging in this virtual visit, you consent to the provision of healthcare and authorize for your insurance to be billed (if applicable) for the services provided during this visit. Depending on your insurance coverage, you may receive a charge related to this service.  I need to obtain your verbal consent now. Are you willing to proceed with your visit today? Jeffrey LELON Primus Mickey. has provided verbal consent on 12/09/2023 for a virtual visit (video or telephone). Loa Lamp, FNP  Date: 12/09/2023 3:09 PM   Virtual Visit via Video Note   I, Loa Lamp, connected with  Jeffrey Reilly.  ([id number], [birth year]) on 12/09/23 at  3:00 PM EDT by a video-enabled telemedicine application and verified that I am speaking with the correct person using two identifiers.  Location: Patient: Virtual Visit Location Patient:  Home Provider: Virtual Visit Location Provider: Home Office   I discussed the limitations of evaluation and management by telemedicine and the availability of in person appointments. The patient expressed understanding and agreed to proceed.    History of Present Illness: Jeffrey Reilly. is a 43 y.o. who identifies as a male who was assigned male at birth, and is being seen today for feeling like he is catching something. His wife is a Engineer, site and has had some cold sx. He feels sluggish and has to fly out for work Wednesday. No fever. No URI sx. SABRA  HPI: HPI  Problems:  Patient Active Problem List   Diagnosis Date Noted   Crohn's disease of small intestine with other complication (HCC) 06/23/2023   Eczema 02/20/2023   Stucco keratoses 02/20/2023   Hyperglycemia 02/20/2023   B12 deficiency 08/31/2021   Encounter for general adult medical examination with abnormal findings 08/31/2021   Calculus of gallbladder without cholecystitis without obstruction 10/01/2020   Paroxysmal atrial fibrillation (HCC) 09/05/2019   Long-term use of immunosuppressant medication 02/13/2013   Infliximab  (Remicade ) long-term use 01/08/2013   Crohn's disease of both small and large intestine with intestinal obstruction (HCC) 06/08/2012   Hepatic venous outflow obstruction 05/09/2012   On total parenteral nutrition (TPN) 04/22/2012    Allergies:  Allergies  Allergen Reactions   Azathioprine     swelling   Acetaminophen  Other (See Comments)    Headache discomfort   Chlorhexidine Rash   Medications:  Current Outpatient Medications:    Cyanocobalamin  (B-12) 100 MCG TABS, Take by mouth., Disp: , Rfl:    diltiazem  (CARDIZEM ) 30 MG tablet, Take 1 tablet every 4 hours AS NEEDED for AFIB heart rate >100, Disp: 45 tablet, Rfl: 1   Ergocalciferol  10 MCG (400 UNIT) TABS, Take by mouth., Disp: , Rfl:    inFLIXimab -axxq (AVSOLA ) 100 MG injection, Inject into the vein., Disp: , Rfl:    potassium citrate  (UROCIT-K) 5 MEQ (540 MG) SR tablet, Take 15 mEq by mouth 2 (two) times daily., Disp: , Rfl:    Probiotic Product (PROBIOTIC DAILY PO), Take 1 capsule by mouth every morning. , Disp: , Rfl:   Observations/Objective: Patient is well-developed, well-nourished in no acute distress.  Resting comfortably  at home.  Head is normocephalic, atraumatic.  No labored breathing.  Speech is clear and coherent with logical content.  Patient is alert and oriented at baseline.    Assessment and Plan: 1. Other fatigue (Primary)  Increase fluids, MVI, extra vit c, rest, sun, call back is sx develop or change.   Follow Up Instructions: I discussed the assessment and treatment plan with the patient. The patient was provided an opportunity to ask questions and all were answered. The patient agreed with the plan and demonstrated an understanding of the instructions.  A copy of instructions were sent to the patient via MyChart unless otherwise noted below.     The patient was advised to call back or seek an in-person evaluation if the symptoms worsen or if the condition fails to improve as anticipated.    Vondell Sowell, FNP

## 2023-12-27 DIAGNOSIS — D2262 Melanocytic nevi of left upper limb, including shoulder: Secondary | ICD-10-CM | POA: Diagnosis not present

## 2023-12-27 DIAGNOSIS — D485 Neoplasm of uncertain behavior of skin: Secondary | ICD-10-CM | POA: Diagnosis not present

## 2023-12-27 DIAGNOSIS — D1801 Hemangioma of skin and subcutaneous tissue: Secondary | ICD-10-CM | POA: Diagnosis not present

## 2023-12-27 DIAGNOSIS — L821 Other seborrheic keratosis: Secondary | ICD-10-CM | POA: Diagnosis not present

## 2023-12-27 DIAGNOSIS — L814 Other melanin hyperpigmentation: Secondary | ICD-10-CM | POA: Diagnosis not present

## 2023-12-29 ENCOUNTER — Encounter: Payer: Self-pay | Admitting: Pediatrics

## 2024-01-01 ENCOUNTER — Ambulatory Visit (HOSPITAL_COMMUNITY)
Admission: RE | Admit: 2024-01-01 | Discharge: 2024-01-01 | Disposition: A | Discharge status: Home / Self Care | Source: Ambulatory Visit | Attending: Physician Assistant | Admitting: Physician Assistant

## 2024-01-01 ENCOUNTER — Other Ambulatory Visit: Payer: Self-pay | Admitting: Pediatrics

## 2024-01-01 VITALS — BP 120/76 | HR 72 | Ht 70.0 in | Wt 161.6 lb

## 2024-01-01 DIAGNOSIS — I480 Paroxysmal atrial fibrillation: Secondary | ICD-10-CM | POA: Diagnosis not present

## 2024-01-01 DIAGNOSIS — I4891 Unspecified atrial fibrillation: Secondary | ICD-10-CM | POA: Diagnosis not present

## 2024-01-01 MED ORDER — DILTIAZEM HCL 30 MG PO TABS: ORAL_TABLET | ORAL | 1 refills | Status: AC

## 2024-01-01 NOTE — Progress Notes (Signed)
 Primary Care Physician: Joshua Debby CROME, MD Primary Cardiologist: none Primary Electrophysiologist: none Referring Physician: [facility]   Jeffrey Reilly Jeffrey Mickey. is a 43 y.o. male with a history of Crohn disease and paroxysmal atrial fibrillation who presents for follow up in the [facility]. The patient was initially diagnosed with atrial fibrillation on 08/29/19 after presenting to the ED with symptoms of palpitations. He converted to SR in the ER without intervention. Patient is not on anticoagulation for a CHADS2VASC score of 0. There were no specific triggers that he could identify. He denies snoring or significant alcohol use.   Patient returns for follow up for atrial fibrillation. He reports that has had one interim episode of afib about 4 months ago. He woke in the middle of the night with palpitations which lasted 10-15 minutes. There were no specific triggers that he could identify.   Today, he  denies symptoms of palpitations, chest pain, shortness of breath, orthopnea, PND, lower extremity edema, dizziness, presyncope, syncope, snoring, daytime somnolence, bleeding, or neurologic sequela. The patient is tolerating medications without difficulties and is otherwise without complaint today.    Atrial Fibrillation Risk Factors:  he does not have symptoms or diagnosis of sleep apnea. he does not have a history of rheumatic fever. he does not have a history of alcohol use. The patient does have a history of early familial atrial fibrillation or other arrhythmias. Mother has afib.   Atrial Fibrillation Management history:  Previous antiarrhythmic drugs: none Previous cardioversions: none Previous ablations: none Anticoagulation history: none   Past Medical History:  Diagnosis Date   Crohn's disease of both small and large intestine with intestinal obstruction (HCC) 03/2012   treated in [place]    GERD (gastroesophageal reflux disease)    History of  kidney stones     Current Outpatient Medications  Medication Sig Dispense Refill   Cyanocobalamin  (B-12) 100 MCG TABS Take by mouth. (Patient taking differently: Take 1 tablet by mouth 3 (three) times a week.)     diltiazem  (CARDIZEM ) 30 MG tablet Take 1 tablet every 4 hours AS NEEDED for AFIB heart rate >100 45 tablet 1   Ergocalciferol  10 MCG (400 UNIT) TABS Take by mouth. (Patient taking differently: Take 1 tablet by mouth every morning.)     inFLIXimab -axxq (AVSOLA ) 100 MG injection Inject into the vein. (Patient taking differently: Inject into the vein every 8 (eight) weeks.)     potassium citrate (UROCIT-K) 5 MEQ (540 MG) SR tablet Take 15 mEq by mouth 2 (two) times daily.     Probiotic Product (PROBIOTIC DAILY PO) Take 1 capsule by mouth every morning.      No current facility-administered medications for this encounter.    ROS- All systems are reviewed and negative except as per the HPI above.  Physical Exam: Vitals:   01/01/24 1523  BP: 120/76  Pulse: 72  Weight: 73.3 kg  Height: 5' 10 (1.778 m)    GEN: Well nourished, well developed in no acute distress CARDIAC: Regular rate and rhythm, no murmurs, rubs, gallops RESPIRATORY:  Clear to auscultation without rales, wheezing or rhonchi  ABDOMEN: Soft, non-tender, non-distended EXTREMITIES:  No edema; No deformity    Wt Readings from Last 3 Encounters:  01/01/24 73.3 kg  12/07/23 72.5 kg  10/12/23 71.7 kg    EKG today demonstrates  SR Vent. rate 72 BPM PR interval 136 ms QRS duration 80 ms QT/QTcB 366/400 ms   Echo 09/11/19 demonstrated  1.  Left ventricular ejection fraction, by estimation, is 60 to 65%. The  left ventricle has normal function. The left ventricle has no regional  wall motion abnormalities. Left ventricular diastolic parameters were  normal.   2. Right ventricular systolic function is normal. The right ventricular  size is normal. Tricuspid regurgitation signal is inadequate for assessing PA  pressure.   3. The mitral valve is normal in structure. No evidence of mitral valve  regurgitation. No evidence of mitral stenosis.   4. The aortic valve is tricuspid. Aortic valve regurgitation is not  visualized. Mild aortic valve sclerosis is present, with no evidence of  aortic valve stenosis.   5. The inferior vena cava is normal in size with greater than 50%  respiratory variability, suggesting right atrial pressure of 3 mmHg.   Epic records are reviewed at length today   CHA2DS2-VASc Score = 0  The patient's score is based upon: CHF History: 0 HTN History: 0 Diabetes History: 0 Stroke History: 0 Vascular Disease History: 0 Age Score: 0 Gender Score: 0       ASSESSMENT AND PLAN: Paroxysmal Atrial Fibrillation (ICD10:  I48.0) The patient's CHA2DS2-VASc score is 0, indicating a 0.2% annual risk of stroke.   Patient appears to be maintaining SR Continue diltiazem  30 mg PRN q 4 hours for heart racing. Not currently on anticoagulation with low CV score.    Follow up in the AF clinic in one year.    Daril Kicks PA-C Afib Clinic [facility] [address] [place], [place] [address] [phone] 01/01/2024 3:42 PM

## 2024-01-12 ENCOUNTER — Other Ambulatory Visit (HOSPITAL_COMMUNITY): Payer: Self-pay

## 2024-01-12 ENCOUNTER — Encounter: Payer: Self-pay | Admitting: Internal Medicine

## 2024-01-12 MED ORDER — FLUZONE 0.5 ML IM SUSY
0.5000 mL | PREFILLED_SYRINGE | Freq: Once | INTRAMUSCULAR | 0 refills | Status: AC
  Filled 2024-01-12: qty 0.5, 1d supply, fill #0

## 2024-02-01 ENCOUNTER — Ambulatory Visit

## 2024-02-01 VITALS — BP 130/83 | HR 68 | Temp 98.0°F | Resp 16 | Ht 70.0 in | Wt 163.2 lb

## 2024-02-01 DIAGNOSIS — K50018 Crohn's disease of small intestine with other complication: Secondary | ICD-10-CM

## 2024-02-01 MED ORDER — METHYLPREDNISOLONE SODIUM SUCC 40 MG IJ SOLR: 40.0000 mg | Freq: Once | INTRAMUSCULAR | Status: DC

## 2024-02-01 MED ORDER — ACETAMINOPHEN 325 MG PO TABS: 650.0000 mg | ORAL_TABLET | Freq: Once | ORAL | Status: DC

## 2024-02-01 MED ORDER — DIPHENHYDRAMINE HCL 25 MG PO CAPS: 25.0000 mg | ORAL_CAPSULE | Freq: Once | ORAL | Status: DC

## 2024-02-01 MED ORDER — SODIUM CHLORIDE 0.9 % IV SOLN
5.0000 mg/kg | INTRAVENOUS | Status: DC
  Administered 2024-02-01: 400 mg via INTRAVENOUS
  Filled 2024-02-01: qty 40

## 2024-02-01 NOTE — Progress Notes (Signed)
 Diagnosis: Crohn's Disease  Provider:  Mannam, Praveen MD  Procedure: IV Infusion  IV Type: Peripheral, IV Location: R Forearm  Avsola  (infliximab -axxq), Dose: 400 mg  Infusion Start Time: 0948 am  Infusion Stop Time: 1202 pm  Post Infusion IV Care: Observation period completed and Peripheral IV Discontinued  Discharge: Condition: Good, Destination: Home . AVS Declined  Performed by:  Richel Millspaugh, RN

## 2024-02-02 ENCOUNTER — Ambulatory Visit: Payer: Self-pay | Admitting: Pediatrics

## 2024-02-05 LAB — COMPREHENSIVE METABOLIC PANEL WITH GFR
AG Ratio: 1.8 (calc) (ref 1.0–2.5)
ALT: 17 U/L (ref 9–46)
AST: 20 U/L (ref 10–40)
Albumin: 4.3 g/dL (ref 3.6–5.1)
Alkaline phosphatase (APISO): 45 U/L (ref 36–130)
BUN/Creatinine Ratio: 12 (calc) (ref 6–22)
BUN: 16 mg/dL (ref 7–25)
CO2: 26 mmol/L (ref 20–32)
Calcium: 9.3 mg/dL (ref 8.6–10.3)
Chloride: 101 mmol/L (ref 98–110)
Creat: 1.32 mg/dL — ABNORMAL HIGH (ref 0.60–1.29)
Globulin: 2.4 g/dL (ref 1.9–3.7)
Glucose, Bld: 92 mg/dL (ref 65–99)
Potassium: 3.7 mmol/L (ref 3.5–5.3)
Sodium: 139 mmol/L (ref 135–146)
Total Bilirubin: 1 mg/dL (ref 0.2–1.2)
Total Protein: 6.7 g/dL (ref 6.1–8.1)
eGFR: 68 mL/min/1.73m2 (ref >=60)

## 2024-02-05 LAB — CBC WITH DIFFERENTIAL/PLATELET
Absolute Lymphocytes: 3320 {cells}/uL (ref 850–3900)
Absolute Monocytes: 560 {cells}/uL (ref 200–950)
Basophils Absolute: 40 {cells}/uL (ref 0–200)
Basophils Relative: 0.5 %
Eosinophils Absolute: 200 {cells}/uL (ref 15–500)
Eosinophils Relative: 2.5 %
HCT: 48.9 % (ref 38.5–50.0)
Hemoglobin: 16.3 g/dL (ref 13.2–17.1)
MCH: 30.5 pg (ref 27.0–33.0)
MCHC: 33.3 g/dL (ref 32.0–36.0)
MCV: 91.6 fL (ref 80.0–100.0)
MPV: 11.5 fL (ref 7.5–12.5)
Monocytes Relative: 7 %
Neutro Abs: 3880 {cells}/uL (ref 1500–7800)
Neutrophils Relative %: 48.5 %
Platelets: 178 Thousand/uL (ref 140–400)
RBC: 5.34 Million/uL (ref 4.20–5.80)
RDW: 12.7 % (ref 11.0–15.0)
Total Lymphocyte: 41.5 %
WBC: 8 Thousand/uL (ref 3.8–10.8)

## 2024-02-05 LAB — C-REACTIVE PROTEIN: CRP: <3 mg/L (ref <8.0)

## 2024-02-05 LAB — VITAMIN D 1,25 DIHYDROXY
Vitamin D 1, 25 (OH)2 Total: 43 pg/mL (ref 18–72)
Vitamin D2 1, 25 (OH)2: <8 pg/mL
Vitamin D3 1, 25 (OH)2: 43 pg/mL

## 2024-02-05 LAB — IRON,TIBC AND FERRITIN PANEL
%SAT: 26 % (ref 20–48)
Ferritin: 23 ng/mL — ABNORMAL LOW (ref 38–380)
Iron: 95 ug/dL (ref 50–180)
TIBC: 363 ug/dL (ref 250–425)

## 2024-02-05 LAB — SEDIMENTATION RATE: Sed Rate: 9 mm/h (ref 0–15)

## 2024-02-05 LAB — SPECIMEN COMPROMISED

## 2024-02-05 LAB — FOLATE: Folate: 19.2 ng/mL

## 2024-02-05 LAB — VITAMIN B12: Vitamin B-12: 425 pg/mL (ref 200–1100)

## 2024-02-07 ENCOUNTER — Telehealth: Payer: Self-pay

## 2024-02-07 NOTE — Telephone Encounter (Signed)
 Copied from CRM [id number]. Topic: Clinical - Request for Lab/Test Order >> Feb 07, 2024  9:29 AM Suzen RAMAN wrote: Reason for CRM: Patient would like orders placed for Lipid Panel.   Please contact patient to schedule. CB#[phone]

## 2024-02-08 ENCOUNTER — Ambulatory Visit: Payer: Self-pay | Admitting: Internal Medicine

## 2024-02-08 ENCOUNTER — Encounter: Admitting: Internal Medicine

## 2024-02-08 ENCOUNTER — Encounter: Payer: Self-pay | Admitting: Internal Medicine

## 2024-02-08 ENCOUNTER — Other Ambulatory Visit (HOSPITAL_COMMUNITY): Payer: Self-pay

## 2024-02-08 ENCOUNTER — Ambulatory Visit (INDEPENDENT_AMBULATORY_CARE_PROVIDER_SITE_OTHER): Admitting: Internal Medicine

## 2024-02-08 VITALS — BP 130/76 | HR 63 | Temp 98.4°F | Ht 70.0 in | Wt 164.2 lb

## 2024-02-08 DIAGNOSIS — E781 Pure hyperglyceridemia: Secondary | ICD-10-CM

## 2024-02-08 DIAGNOSIS — L851 Acquired keratosis [keratoderma] palmaris et plantaris: Secondary | ICD-10-CM | POA: Insufficient documentation

## 2024-02-08 DIAGNOSIS — I480 Paroxysmal atrial fibrillation: Secondary | ICD-10-CM | POA: Diagnosis not present

## 2024-02-08 DIAGNOSIS — K50812 Crohn's disease of both small and large intestine with intestinal obstruction: Secondary | ICD-10-CM

## 2024-02-08 LAB — LIPID PANEL
Cholesterol: 108 mg/dL (ref 0–200)
HDL: 48.5 mg/dL (ref >39.00)
LDL Cholesterol: 20 mg/dL (ref 0–99)
NonHDL: 59.58
Total CHOL/HDL Ratio: 2
Triglycerides: 196 mg/dL — ABNORMAL HIGH (ref 0.0–149.0)
VLDL: 39.2 mg/dL (ref 0.0–40.0)

## 2024-02-08 LAB — TSH: TSH: 1.99 u[IU]/mL (ref 0.35–5.50)

## 2024-02-08 MED ORDER — COVID-19 MRNA VAC-TRIS(PFIZER) 30 MCG/0.3ML IM SUSY
0.3000 mL | PREFILLED_SYRINGE | Freq: Once | INTRAMUSCULAR | 0 refills | Status: AC
  Filled 2024-02-08: qty 0.3, 1d supply, fill #0

## 2024-02-08 NOTE — Patient Instructions (Signed)
 Atrial Fibrillation Atrial fibrillation (AFib) is a type of irregular or rapid heartbeat (arrhythmia). In AFib, the top part of the heart (atria) beats in an irregular pattern. This makes the heart unable to pump blood normally and effectively. The goal of treatment is to prevent blood clots from forming, control your heart rate, or restore your heartbeat to a normal rhythm. If this condition is not treated, it can cause serious problems, such as a weakened heart muscle (cardiomyopathy) or a stroke. What are the causes? This condition is often caused by medical conditions that damage the heart's electrical system. These include: High blood pressure (hypertension). This is the most common cause. Certain heart problems or conditions, such as heart failure, coronary artery disease, heart valve problems, or heart surgery. Diabetes. Overactive thyroid  (hyperthyroidism). Chronic kidney disease. Certain lung conditions, such as emphysema, pneumonia, or COPD. Obstructive sleep apnea. In some cases, the cause of this condition is not known. What increases the risk? This condition is more likely to develop in: Older adults. Athletes who do endurance exercise. People who have a family history of AFib. Males. People who are Caucasian. People who are obese. People who smoke or misuse alcohol. What are the signs or symptoms? Symptoms of this condition include: Fast or irregular heartbeats (palpitations). Discomfort or pain in your chest. Shortness of breath. Sudden light-headedness or weakness. Tiring easily during exercise or activity. Syncope (fainting). Sweating. In some cases, there are no symptoms. How is this diagnosed? Your health care provider may detect AFib when taking your pulse. If detected, this condition may be diagnosed with: An electrocardiogram (ECG) to check electrical signals of the heart. An ambulatory cardiac monitor to record your heart's activity for a few days. A  transthoracic echocardiogram (TTE) to create pictures of your heart. A transesophageal echocardiogram (TEE) to create even clearer pictures of your heart. A stress test to check your blood supply while you exercise. Imaging tests, such as a CT scan or chest X-ray. Blood tests. How is this treated? Treatment depends on underlying conditions and how you feel when you get AFib. This condition may be treated with: Medicines to prevent blood clots or to treat heart rate or heart rhythm problems. Electrical cardioversion to reset the heart's rhythm. A pacemaker to correct abnormal heart rhythm. Ablation to remove the heart tissue that sends abnormal signals. Left atrial appendage closure to seal the area where blood clots can form. In some cases, underlying conditions will be treated. Follow these instructions at home: Medicines Take over-the counter and prescription medicines only as told by your provider. Do not take any new medicines without talking to your provider. If you are taking blood thinners: Talk with your provider before taking aspirin  or NSAIDs. These medicines can raise your risk of bleeding. Take your medicines as told. Take them at the same time each day. Do not do things that could hurt or bruise you. Be careful to avoid falls. Wear an alert bracelet or carry a card that says that you take blood thinners. Lifestyle Do not use any products that contain nicotine or tobacco. These products include cigarettes, chewing tobacco, and vaping devices, such as e-cigarettes. If you need help quitting, ask your provider. Eat heart-healthy foods. Talk with a food expert (dietitian) to make an eating plan that is right for you. Exercise regularly as told by your provider. Do not drink alcohol. Lose weight if you are overweight. General instructions If you have obstructive sleep apnea, manage your condition as told by your provider.  Do not use diet pills unless your provider approves. Diet  pills can make heart problems worse. Keep all follow-up visits. Your provider will want to check your heart rate and rhythm regularly. Contact a health care provider if: You notice a change in the rate, rhythm, or strength of your heartbeat. You are taking a blood thinner and you notice more bruising. You tire more easily when you exercise or do heavy work. You have a sudden change in weight. Get help right away if:  You have chest pain. You have trouble breathing. You have side effects of blood thinners, such as blood in your vomit, poop (stool), or pee (urine), or bleeding that does not stop. You have any symptoms of a stroke. BE FAST is an easy way to remember the main warning signs of a stroke: B - Balance. Signs are dizziness, sudden trouble walking, or loss of balance. E - Eyes. Signs are trouble seeing or a sudden change in vision. F - Face. Signs are sudden weakness or numbness of the face, or the face or eyelid drooping on one side. A - Arms. Signs are weakness or numbness in an arm. This happens suddenly and usually on one side of the body. S - Speech.Signs are sudden trouble speaking, slurred speech, or trouble understanding what people say. T - Time. Time to call emergency services. Write down what time symptoms started. Other signs of a stroke, such as: A sudden, severe headache with no known cause. Nausea or vomiting. Seizure. These symptoms may be an emergency. Get help right away. Call 911. Do not wait to see if the symptoms will go away. Do not drive yourself to the hospital. This information is not intended to replace advice given to you by your health care provider. Make sure you discuss any questions you have with your health care provider. Document Revised: 11/24/2021 Document Reviewed: 11/24/2021 Elsevier Patient Education  2024 [employer].

## 2024-02-08 NOTE — Progress Notes (Signed)
 Subjective:  Patient ID: Jeffrey Reilly., male    DOB: 1980-07-03  Age: 43 y.o. MRN: [id number]  CC: Atrial Fibrillation and Hyperlipidemia   HPI Karmello Abercrombie. presents for f/up ---  Discussed the use of AI scribe software for clinical note transcription with the patient, who gave verbal consent to proceed.  History of Present Illness Florian Chauca. is a 43 year old male who presents for an annual check-up and lipid panel.  He feels well overall with no symptoms of bowel disease, including Crohn's disease, abdominal pain, nausea, vomiting, diarrhea, or constipation. His bowel movements are normal.  He has a history of atrial fibrillation, with the last episode occurring approximately five to six months ago. During that episode, he experienced palpitations upon waking but then fell back asleep and had no further symptoms. He mentioned this to his cardiology provider and [facility].  He has a history of kidney stones and is under urological care, with plans for a 24-hour urine test to monitor his condition.  He has not received the COVID booster this year but has received the flu shot. He plans to get the shingles and hepatitis A and B vaccines as recommended by his GI doctor.  No testicular pain, swelling, or urinary issues. No recent changes in weight or appetite, and no pain in the spleen, stomach, or liver areas.     Outpatient Medications Prior to Visit  Medication Sig Dispense Refill   Cyanocobalamin  (B-12) 100 MCG TABS Take by mouth. (Patient taking differently: Take 1 tablet by mouth 3 (three) times a week.)     diltiazem  (CARDIZEM ) 30 MG tablet Take 1 tablet every 4 hours AS NEEDED for AFIB heart rate >100 15 tablet 1   Ergocalciferol  10 MCG (400 UNIT) TABS Take by mouth. (Patient taking differently: Take 1 tablet by mouth every morning.)     inFLIXimab -axxq (AVSOLA ) 100 MG injection Inject into the vein. (Patient taking differently:  Inject into the vein every 8 (eight) weeks.)     potassium citrate (UROCIT-K) 5 MEQ (540 MG) SR tablet Take 15 mEq by mouth 2 (two) times daily.     Probiotic Product (PROBIOTIC DAILY PO) Take 1 capsule by mouth every morning.      No facility-administered medications prior to visit.    ROS Review of Systems  Constitutional:  Negative for appetite change, chills, diaphoresis, fatigue and fever.  HENT: Negative.    Eyes: Negative.   Respiratory: Negative.  Negative for chest tightness and shortness of breath.   Cardiovascular:  Negative for chest pain, palpitations and leg swelling.  Gastrointestinal:  Negative for abdominal pain, constipation, diarrhea, nausea and vomiting.  Endocrine: Negative.   Genitourinary: Negative.  Negative for difficulty urinating.  Musculoskeletal: Negative.  Negative for arthralgias and myalgias.  Skin: Negative.   Neurological: Negative.  Negative for dizziness, weakness and light-headedness.  Hematological:  Negative for adenopathy. Does not bruise/bleed easily.  Psychiatric/Behavioral: Negative.      Objective:  BP 130/76 (BP Location: Left Arm, Patient Position: Sitting, Cuff Size: Normal)   Pulse 63   Temp 98.4 F (36.9 C) (Oral)   Ht 5' 10 (1.778 m)   Wt 164 lb 3.2 oz (74.5 kg)   SpO2 97%   BMI 23.56 kg/m   BP Readings from Last 3 Encounters:  02/08/24 130/76  02/01/24 130/83  01/01/24 120/76    Wt Readings from Last 3 Encounters:  02/08/24  164 lb 3.2 oz (74.5 kg)  02/01/24 163 lb 3.2 oz (74 kg)  01/01/24 161 lb 9.6 oz (73.3 kg)    Physical Exam Vitals reviewed.  Constitutional:      Appearance: Normal appearance.  HENT:     Nose: Nose normal.     Mouth/Throat:     Mouth: Mucous membranes are moist.  Eyes:     Conjunctiva/sclera: Conjunctivae normal.  Cardiovascular:     Rate and Rhythm: Normal rate and regular rhythm.     Pulses: Normal pulses.     Heart sounds: No murmur heard.    No friction rub. No gallop.   Pulmonary:     Effort: Pulmonary effort is normal.     Breath sounds: No stridor. No wheezing, rhonchi or rales.  Abdominal:     General: Abdomen is flat. Bowel sounds are normal.     Palpations: There is no mass.     Tenderness: There is no abdominal tenderness. There is no guarding.     Hernia: No hernia is present.  Musculoskeletal:        General: Normal range of motion.     Cervical back: Neck supple.     Right lower leg: No edema.     Left lower leg: No edema.  Lymphadenopathy:     Cervical: No cervical adenopathy.  Skin:    General: Skin is warm and dry.  Neurological:     General: No focal deficit present.     Mental Status: He is alert.  Psychiatric:        Mood and Affect: Mood normal.        Behavior: Behavior normal.     Lab Results  Component Value Date   WBC 8.0 02/01/2024   HGB 16.3 02/01/2024   HCT 48.9 02/01/2024   PLT 178 02/01/2024   GLUCOSE 92 02/01/2024   CHOL 108 02/08/2024   TRIG 196.0 (H) 02/08/2024   HDL 48.50 02/08/2024   LDLCALC 20 02/08/2024   ALT 17 02/01/2024   AST 20 02/01/2024   NA 139 02/01/2024   K 3.7 02/01/2024   CL 101 02/01/2024   CREATININE 1.32 (H) 02/01/2024   BUN 16 02/01/2024   CO2 26 02/01/2024   TSH 1.99 02/08/2024   PSA 0.98 05/22/2017   HGBA1C 5.1 02/20/2023    No results found.  Assessment & Plan:  Hypertriglyceridemia, essential- Does not need to be treated. -     Lipid panel; Future  Paroxysmal atrial fibrillation (HCC)- He has good R/R control. -     TSH; Future  Crohn's disease of both small and large intestine with intestinal obstruction (HCC) -     COVID-19 mRNA Vac-TriS(Pfizer); Inject 0.3 mLs into the muscle once for 1 dose.  Dispense: 0.3 mL; Refill: 0     Follow-up: Return in about 6 months (around 08/07/2024).  Debby Molt, MD

## 2024-02-09 NOTE — Telephone Encounter (Signed)
 Orders were placed. Patient had labs done. Comments has been seen by Dr. Joshua.

## 2024-02-22 DIAGNOSIS — N200 Calculus of kidney: Secondary | ICD-10-CM | POA: Diagnosis not present

## 2024-02-27 DIAGNOSIS — R82992 Hyperoxaluria: Secondary | ICD-10-CM | POA: Diagnosis not present

## 2024-02-27 DIAGNOSIS — R82991 Hypocitraturia: Secondary | ICD-10-CM | POA: Diagnosis not present

## 2024-02-28 ENCOUNTER — Encounter: Admitting: Internal Medicine

## 2024-03-03 NOTE — Progress Notes (Unsigned)
 [facility] Return Visit   Referring Provider Joshua Debby CROME, MD [address] [place],  [place] [address]  Primary Care Provider Joshua Debby CROME, MD  Patient Profile: Jeffrey Reilly. is a 43 y.o. male who returns to the [facility] for follow-up of the problem(s) noted below.  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 History of cholelithiasis  History of nephrolithiasis-calcium oxalate in nature likely related to intestinal resection   History of Present Illness   Jeffrey Reilly was last seen by me at [facility] 01/11/2023   Current GI Meds  Infliximab  (Avsola ) 5 mg/kg every 8 weeks (09/2023  IFX 8.6, Ab 0)  Interval History  Jeffrey Reilly reports that he has overall been feeling well since his last visit with me at [facility] in October 2024 He underwent restaging colonoscopy 10/31/2022 that showed an endoscopically normal colon and terminal ileum.  There was a patent ileal ileal anastomosis without inflammation  Currently reports having 2-3 generally formed bowel movements a day without any blood or mucus Notes that stress and some foods may increase his bowel frequency -finds he can tolerate oatmeal and avoids fatty foods; limits alcohol consumption Takes Imodium  on rare occasions when traveling Denies abdominal pain or cramping No upper GI tract symptoms of GERD, nausea, vomiting, dysphagia or odynophagia  Denies extraintestinal manifestations of fevers, chills, joint pain, skin rashes or oral ulcers  No significant intercurrent infectious illnesses Travel to [place] and [place] this past winter without any illnesses  Weight, energy and appetite are stable-current weight 159 pounds  He has been receiving infusions with Avsola   through [facility] and would be interested in transitioning to the [facility] infusion center Next infusion due Aug 02, 2023  Last colonoscopy:  02/2023 -endoscopically normal colon and terminal ileum-patent ileal ileal anastomosis without inflammation Last endoscopy:  07/2012 - normal  Last Abd CT/CTE/MRE:  12/2020 - roughly 40 cm of small bowel demonstrating mild epithelial hyperenhancement suggesting mild acute on chronic Crohn's located just proximal to the patient's anastomosis. There is associated fibrofatty proliferation and prominence of the vasa recta.   GI Review of Symptoms Significant for None. Otherwise negative.  General Review of Systems  Review of systems is significant for the pertinent positives and negatives as listed per the HPI.  Full ROS is otherwise negative.   Inflammatory Bowel Disease History  - 1994 - Diagnosed with small bowel Crohn's disease that he managed with complementary therapy and diet - 03/2012 - Small bowel volvulus secondary to internal hernia status post resection of 194 cm of mid small bowel (3 surgeries) - required ileostomy, TPN; Tx'd w/azathioprine --> developed pancreatitis/hepatitis in [place]  - 07/2012 - EGD/Colonoscopy/Ileoscopy -mild to moderate erythema and friability in the colon, nl EGD, Ileum with moderate stenosis but no inflammation - 10/2012 - Ileostomy takedown - 11/2012 - Induction IFX 5 mg/kg Q8 weeks - 12/2017 - Colonoscopy - normal colon and TI - 12/2020 - MRE -mild epithelial hyperenhancement suggesting mild acute on chronic Crohn's disease located just proximal to the anastomosis - 02/2023 - Colonoscopy - normal colon and TI  IBD Medication History Complementary therapies -used for the first 2 decades of diagnosis Azathioprine-resulted in pancreatitis/hepatitis 2014 Infliximab -initiated in 2014 with adequate response   Past Medical History   Past Medical History:  Diagnosis Date  Crohn's disease of both small  and large intestine with intestinal obstruction (HCC) 03/2012   treated in [place]    GERD (gastroesophageal reflux disease)    History of kidney stones      Past Surgical History   Past Surgical History:  Procedure Laterality Date   CYSTOSCOPY WITH RETROGRADE PYELOGRAM, URETEROSCOPY AND STENT PLACEMENT Right 08/18/2017   Procedure: CYSTOSCOPY WITH RETROGRADE PYELOGRAM, URETEROSCOPY AND STENT PLACEMENT;  Surgeon: Alvaro Hummer, MD;  Location: [facility];  Service: Urology;  Laterality: Right;  45 MINS   HERNIA REPAIR     incisional    HOLMIUM LASER APPLICATION Right 08/18/2017   Procedure: HOLMIUM LASER APPLICATION;  Surgeon: Alvaro Hummer, MD;  Location: [facility];  Service: Urology;  Laterality: Right;   SMALL INTESTINE SURGERY  03/2012   190 cm of small intestine removed     Allergies and Medications   Allergies  Allergen Reactions   Azathioprine     swelling   Acetaminophen  Other (See Comments)    Headache discomfort   Chlorhexidine Rash    No outpatient medications have been marked as taking for the 03/04/24 encounter (Appointment) with Suzann Inocente HERO, MD.     Family History   Family History  Problem Relation Age of Onset   Diabetes Father    Cancer Other        Breast and Prostate Cancer   Alcohol abuse Neg Hx    Heart disease Neg Hx    Hyperlipidemia Neg Hx    Hypertension Neg Hx    Kidney disease Neg Hx    Stroke Neg Hx    Early death Neg Hx     Social History   Social History   Tobacco Use   Smoking status: Never   Smokeless tobacco: Never   Tobacco comments:    Never smoked 11/24/22  Vaping Use   Vaping status: Never Used  Substance Use Topics   Alcohol use: Yes    Alcohol/week: 2.0 standard drinks of alcohol    Types: 1 Cans of beer, 1 Standard drinks or equivalent per week    Comment: rarely    Drug use: No   Jeffrey Reilly reports that he has never smoked. He has never used smokeless tobacco. He reports current alcohol use of about 2.0 standard  drinks of alcohol per week. He reports that he does not use drugs.  Vital Signs and Physical Examination   There were no vitals filed for this visit.  There is no height or weight on file to calculate BMI.    General: Well developed, well nourished, no acute distress Head: Normocephalic and atraumatic Eyes: Sclerae anicteric, EOMI Lungs: Clear throughout to auscultation Heart: Regular rate and rhythm; No murmurs, rubs or bruits Abdomen: Soft, non tender and non distended. No masses, hepatosplenomegaly or hernias noted. Normal Bowel sounds Rectal: Deferred Musculoskeletal: Symmetrical with no gross deformities   Review of Data  The following data was reviewed at the time of this encounter:  Laboratory Studies      Latest Ref Rng & Units 02/01/2024    9:13 AM 10/12/2023    9:04 AM 10/10/2022   12:00 AM  CBC  WBC 3.8 - 10.8 Thousand/uL 8.0  8.8  8.3      Hemoglobin 13.2 - 17.1 g/dL 83.6  82.8  82.8      Hematocrit 38.5 - 50.0 % 48.9  53.0  52      Platelets 140 - 400 Thousand/uL 178  196  186  This result is from an external source.    Lab Results  Component Value Date   LIPASE 33.0 07/16/2020      Latest Ref Rng & Units 02/01/2024    9:13 AM 10/12/2023    9:04 AM 02/20/2023    8:45 AM  CMP  Glucose 65 - 99 mg/dL 92  872  94   BUN 7 - 25 mg/dL 16  16  17    Creatinine 0.60 - 1.29 mg/dL 8.67  8.81  8.73   Sodium 135 - 146 mmol/L 139  139  140   Potassium 3.5 - 5.3 mmol/L 3.7  4.2  4.7   Chloride 98 - 110 mmol/L 101  102  103   CO2 20 - 32 mmol/L 26  27  30    Calcium 8.6 - 10.3 mg/dL 9.3  9.4  9.6   Total Protein 6.1 - 8.1 g/dL 6.7  7.1    Total Bilirubin 0.2 - 1.2 mg/dL 1.0  1.0    AST 10 - 40 U/L 20  26    ALT 9 - 46 U/L 17  34     IBD Labs  Prebiologic Labs 2024 -hepatitis B surface antigen negative, hepatitis B core antibody total negative, hepatitis B surface antibody nonreactive, QuantiFERON gold negative   Therapeutic Drug Monitoring  Thiopurine  metabolite levels:  Date:                6-TGN       6-MMP  Biologic level and antibodies:    Fecal Calprotectin   Imaging Studies  DEXA 12/28/2022  Normal  MRE 12/2020 Roughly 40 cm of small bowel demonstrating mild epithelial hyperenhancement suggesting  mild acute on chronic Crohn's located just proximal to the patient's anastomosis.  There is associated fibrofatty proliferation and prominence of the vasa recta  CTAP 11/2012 Improved appearance of a right lower quadrant abscess which is now significantly smaller in size containing a small amount of air and fluid. No evidence of drainable collection.  CTAP 10/2012 1. Gas containing fluid collection in the right lower quadrant, suspicious for abscess. The collection measures 4.8 cm in length but only 1.7 cm in AP dimension and is likely too small for percutaneous drainage at this time. 2. Marked small bowel wall thickening and inflammatory change, consistent with active Crohn's disease. No definite fistula visualized. No bowel obstruction.  CT abdomen/pelvis 05/08/12  Mild stranding adjacent to the head of the pancreas and retroperitoneal fluid which may represent pancreatitis.  No peri-pancreatic fluid collections, parenchymal necrosis or other complication. Small ascites.  Persistent enlargement and mottled enhancement pattern of the liver with an large hepatic veins.   CT abdomen/pelvis 04/27/12  Multiple short-segment areas of luminal narrowing within the mid to distal remaining small bowel. Most of these have the appearance of developing fibrous stenotic strictures, although at least 1 area is suggestive of adhesive band causing extrinsic narrowing as described. This is superimposed on diffuse mucosal enhancement that likely represents at least mild active inflammatory component. Moderate peripheral enhancement pattern to the liver within large hepatic veins compatible with hepatic venous outflow compromise without thrombus or  mass.   CT abdomen/pelvis 04/04/12  High-grade distal small-bowel obstruction with CT findings of small bowel compromise. The etiology of the distal small bowel obstruction appears to be volvulus, potentially involving an internal hernia with associated venous mesenteric congestion and a moderate amount of ascites.   CTAP 09/1994 Rad Rpt: Final 10/13/1994 12:58 Req# [id number] Acct# [id number] CT BODY/INTERP OUTSIDE FILMS  10/13/94  INTERPRETATION OF OUTSIDE CT SCAN OF THE ABDOMEN AND PELVIS FROM THE IMAGING CENTER IN [place], [place]  DATED September 16, 1994.  CLINICAL SUMMARY: A FIFTEEN-YEAR-OLD-WHITE MALE WITH CHRONIC ABDOMINAL PAIN.  TECHNIQUE: SIX SHEETS OF FILM WERE SUBMITTED FOR EVALUATION INCLUDING MULTIPLE AXIAL IMAGES OF THE ABDOMEN AND PELVIS FROM THE LOWER LUNG FIELDS TO THE SYMPHYSIS PUBIS. BOTH INTRAVENOUS AND ORAL CONTRAST MATERIAL HAS BEEN ADMINISTERED.  FINDINGS: IMAGES THROUGH THE LOWER LUNG FIELDS ARE NORMAL. THE LIVER IS OVERALL NORMAL IN SIZE AND DENSITY WITHOUT FOCAL LESIONS. THE SPLEEN IS ALSO NORMAL. THE PANCREAS AND ADRENALS AND KIDNEYS ARE NORMAL. THERE ARE NUMEROUS MESENTERIC AND RETROPERITONEAL LYMPH NODES. THE LARGEST MESENTERIC LYMPH NODES ARE ON THE ORDER OF 12 MM IN DIAMETER. THE RETROPERITONEAL LYMPH NODES MEASURE UP TO 8 MM IN DIAMETER. THERE ARE NUMEROUS SMALL BOWEL LOOPS LOCATED PRIMARILY IN THE LEFT ABDOMEN WHICH DEMONSTRATE CIRCUMFERENTIAL THICKENING OF THE WALL AND HAZINESS IN THE PERIENTERIC FAT. THIS IS NOTED FOR A LONG SEGMENT OF SMALL BOWEL ENCOMPASSING THE JEJUNUM AND ILEUM. THE MOST PROXIMAL JEJUNAL LOOPS APPEAR RELATIVELY NORMAL. THERE ARE ALSO APPEARS TO BE SOME TERMINAL ILEAL THICKENING ALTHOUGH THERE IS EVIDENCE FOR MORE NORMAL SMALL BOWEL IN BETWEEN THE PROXIMAL AND TERMINAL ILEUM. THERE IS NO SIGNIFICANT EVIDENCE FOR SMALL OBSTRUCTION AT THIS TIME. IN ADDITION, THERE IS A SOMEWHAT ENCAPSULATED FLUID COLLECTION LOCATED JUST CAUDAL  TO THE HEPATIC FLEXURE AND ADJACENT TO SEVERAL THICKENED LOOPS OF SMALL BOWEL. THE WALL OF THE FLUID COLLECTION IS SOMEWHAT THICK AND IRREGULAR AND THE ENTIRE COLLECTION IS ON THE ORDER OF 3.5 CM TRANSVERSE X 5.0 CM AP. A SMALL AMOUNT OF INTRAPERITONEAL FLUID IS NOTED IN THE PELVIS AS WELL. THE VISUALIZED BONY STRUCTURES ARE GROSSLY NORMAL.  IMPRESSION:  1. NUMEROUS THICKENED AND INFLAMED SMALL BOWEL LOOPS INVOLVING THE DISTAL JEJUNAL AND PROXIMAL ILEUM. THERE IS ALSO THICKENING OF TERMINAL ILEAL LOOPS ALTHOUGH THERE APPEARS TO BE INTERPOSED NORMAL SMALL BOWEL IN THE MID-ILEUM.  2. NUMEROUS SMALL-TO-MILDLY ENLARGED LYMPH NODES IN THE RETROPERITONEUM AND ESPECIALLY THE MESENTERY.  3. THIS COMBINATION OF FINDINGS IS MOSTLY SUGGESTIVE OF INFLAMMATORY BOWEL DISEASE SUCH AS CROHN'S DISEASE AND LYMPHOMA IN THIS SETTING IS FELT TO BE LESS LIKELY.  4. 3.5 X 5.0 CM THICK-WALLED FLUID COLLECTION IN THE REGION OF THE SPLENIC FLEXURE ADJACENT TO THICKENED SMALL BOWEL LOOPS. THIS COLLECTION IS SUSPICIOUS FOR AN ABSCESS.   GI Procedures and Studies  Colonoscopy 03/03/2023 - Normal mucosa in the entire examined colon. Biopsied. - Tortuous colon. - Patent ileoileal surgical anastomosis in the TI, characterized by healthy appearing mucosa. Biopsied. - The distal rectum and anal verge are normal on retroflexion view.   Path: normal  Colonoscopy 01/05/2018 Endoscopically and histologically normal colon and terminal ileum  08/10/2012 EGD - normal Ileoscopy -moderate stenosis-no inflammation Colonoscopy -patchy mild to moderate erythema and contact friability scattered throughout the colon suggestive of diversion colitis  Colonoscopy Pathology 08/1994 MICROSCOPIC EXAMINATION: All of the biopsies are completely normal with the exception of the ileal biopsy in which one of three fragments shows a mild increase in plasma cells and lymphocytes. Scattered eosinophils are also present. An  occasional neutrophil is seen in the lamina propria and surface epithelium. No ulceration is noted.  DIAGNOSIS: 1. (OUTSIDE SLIDE REVIEW, [phone], [facility], [place], [place], DATE OF PROCEDURE 09/13/94):  A. ILEAL ULCERS, BIOPSY:  SMALL INTESTINAL MUCOSA WITH MILD CHRONIC INFLAMMATION. SEE COMMENT. B. RIGHT COLON, RANDOM BIOPSIES:  NORMAL COLONIC MUCOSA.  C. MID COLON, RANDOM BIOPSIES:  COLONIC MUCOSA, NO PATHOLOGIC DIAGNOSIS.  D. COLON (LABELED SUBMUCOSAL MARGIN) BIOPSIES):  COLONIC MUCOSA, NO PATHOLOGIC DIAGNOSIS. SEE COMMENT.  E. LEFT COLON RANDOM BIOPSIES:  COLONIC MUCOSA, NO PATHOLOGIC DIAGNOSIS.  COMMENT: The ileal biopsy does not show an ulcer. Ulcers in the ileum can be due to a number of causes including drugs and Crohn's disease ,for which there is no other evidence in this biopsy. There are a few neutrophils in the surface but these could be operative.  As noted in the outside report, the biopsy of the submucosal nodule in biopsy D is too superficial for evaluation of a submucosal nodule.     Clinical Impression  It is my clinical impression that Jeffrey Reilly is a 43 y.o. male with;  Stricturing small bowel Crohn's disease diagnosed 1994 Small bowel volvulus secondary to internal hernia status post resection of 194 cm of small bowel 2014 Drug-induced pancreatitis and hepatitis secondary to azathioprine History of cholelithiasis History of nephrolithiasis-calcium oxalate in nature likely related to intestinal resection   Jadavion reports being diagnosed with small bowel Crohn's disease in 1994. He managed his condition with complementary therapies and diet for 2 decades.   In 03/2012 while traveling in [place]  he developed a small bowel volvulus secondary to a internal hernia resulting in resection of 194 cm of small bowel over the course of 3 surgeries. He required an ileostomy and TPN. Postoperatively he was initially managed with  azathioprine which resulted in pancreatitis and hepatitis for which the medication was ceased. He ultimately underwent ileostomy takedown 10/2012. In 11/2012 he initiated treatment with infliximab  5 mg/kg Q 8 weeks to which he has responded well over the years. At the time of medication initiation we discussed that he lost a significant amount of small bowel during his volvulus surgery and it would be critical to protect the remaining bowel from active Crohn's disease.  At today's visit he reports generally feeling well from a symptom perspective. MRE in 2022 commented on possible acute on chronic inflammation proximal to his anastomosis.  I reviewed these images at [facility] in November 2024 and Dr. Skeet did not feel there was evidence of active inflammation.  Restaging and surveillance colonoscopy in 02/2023 confirmed endoscopic remission of disease.  At today's visit we discussed transitioning his infusions from [facility] to [facility] infusion center.  Reviewed that IVX can also be another option.  Plan  Continue Avsola  5 mg/kg every 8 weeks -transition from [facility] to [facility] infusion center at patient request.  Next infusion due Aug 02, 2023 Quarterly immune suppressant monitoring labs with infusions: CBC, CMP, ESR, CRP -update therapeutic drug monitoring with next infusion Recommend annual nutritional parameters Continue low oxalate diet for history of nephrolithiasis, also limiting fatty foods given extensive bowel resection May use Imodium  as needed Monitor weight and anthropometrics  IBD Health Maintenance  Vaccinations Influenza: 2024 PCV13: PPSV23: COVID19: HAV/HBV:HBV non-immune - recommended vaccination 12/2022  Shingles: Recommended 12/2022 HPV:  DEXA 12/2022 - normal; repeat prn  Eye Exam PRN  Skin Exam 12/2022 - recommended skin exam  Surveillance Colonoscopy 02/2023 - normal; repeat 2029  Tobacco Use None  Depression Screen     Planned  Follow Up 6 months  The patient or caregiver verbalized understanding of the material covered, with no barriers to understanding. All questions were answered. Patient or caregiver is agreeable with the plan outlined above.    It was a pleasure to see Norleen.  If you have any questions or concerns  regarding this evaluation, do not hesitate to contact me.  Inocente Hausen, MD [facility]

## 2024-03-04 ENCOUNTER — Encounter: Payer: Self-pay | Admitting: Pediatrics

## 2024-03-04 ENCOUNTER — Ambulatory Visit: Admitting: Pediatrics

## 2024-03-04 VITALS — BP 116/68 | HR 72 | Ht 70.0 in | Wt 165.1 lb

## 2024-03-04 DIAGNOSIS — R790 Abnormal level of blood mineral: Secondary | ICD-10-CM | POA: Diagnosis not present

## 2024-03-04 DIAGNOSIS — K50018 Crohn's disease of small intestine with other complication: Secondary | ICD-10-CM

## 2024-03-04 DIAGNOSIS — Z7960 Long term (current) use of unspecified immunomodulators and immunosuppressants: Secondary | ICD-10-CM | POA: Diagnosis not present

## 2024-03-04 NOTE — Patient Instructions (Signed)
 Follow up in 6 months.  Thank you for entrusting me with your care and for choosing [facility], Dr. Inocente Hausen  _______________________________________________________  If your blood pressure at your visit was 140/90 or greater, please contact your primary care physician to follow up on this.  _______________________________________________________  If you are age 45 or older, your body mass index should be between 23-30. Your Body mass index is 23.69 kg/m. If this is out of the aforementioned range listed, please consider follow up with your Primary Care Provider.  If you are age 68 or younger, your body mass index should be between 19-25. Your Body mass index is 23.69 kg/m. If this is out of the aformentioned range listed, please consider follow up with your Primary Care Provider.   ________________________________________________________  The [facility] providers would like to encourage you to use MYCHART to communicate with providers for non-urgent requests or questions.  Due to long hold times on the telephone, sending your provider a message by [facility] may be a faster and more efficient way to get a response.  Please allow 48 business hours for a response.  Please remember that this is for non-urgent requests.  _______________________________________________________  [facility] is using a team-based approach to care.  Your team is made up of your doctor and two to three APPS. Our APPS (Nurse Practitioners and Physician Assistants) work with your physician to ensure care continuity for you. They are fully qualified to address your health concerns and develop a treatment plan. They communicate directly with your gastroenterologist to care for you. Seeing the Advanced Practice Practitioners on your physician's team can help you by facilitating care more promptly, often allowing for earlier appointments, access to diagnostic testing, procedures, and other specialty  referrals.

## 2024-03-21 ENCOUNTER — Encounter: Payer: Self-pay | Admitting: Pediatrics

## 2024-03-27 ENCOUNTER — Telehealth: Payer: Self-pay | Admitting: Pharmacy Technician

## 2024-03-27 ENCOUNTER — Encounter: Payer: Self-pay | Admitting: Pediatrics

## 2024-03-27 NOTE — Telephone Encounter (Signed)
 Insurance has been verified: [employer]

## 2024-03-28 ENCOUNTER — Ambulatory Visit (INDEPENDENT_AMBULATORY_CARE_PROVIDER_SITE_OTHER)

## 2024-03-28 VITALS — BP 144/81 | HR 70 | Temp 98.3°F | Resp 16 | Ht 70.0 in | Wt 165.2 lb

## 2024-03-28 DIAGNOSIS — K50018 Crohn's disease of small intestine with other complication: Secondary | ICD-10-CM | POA: Diagnosis not present

## 2024-03-28 MED ORDER — SODIUM CHLORIDE 0.9 % IV SOLN
5.0000 mg/kg | INTRAVENOUS | Status: DC
  Administered 2024-03-28: 400 mg via INTRAVENOUS
  Filled 2024-03-28: qty 40

## 2024-03-28 MED ORDER — ACETAMINOPHEN 325 MG PO TABS: 650.0000 mg | ORAL_TABLET | Freq: Once | ORAL | Status: DC

## 2024-03-28 MED ORDER — METHYLPREDNISOLONE SODIUM SUCC 40 MG IJ SOLR: 40.0000 mg | Freq: Once | INTRAMUSCULAR | Status: DC

## 2024-03-28 MED ORDER — DIPHENHYDRAMINE HCL 25 MG PO CAPS: 25.0000 mg | ORAL_CAPSULE | Freq: Once | ORAL | Status: DC

## 2024-03-28 NOTE — Progress Notes (Signed)
 Diagnosis:  Crohn's Disease  Provider:  Praveen Mannam MD  Procedure: IV Infusion  IV Type: Peripheral, IV Location: L Antecubital   Avsola  (infliximab -axxq), Dose: 400 mg  Infusion Start Time: 0933  Infusion Stop Time: 1149  Post Infusion IV Care: Peripheral IV Discontinued  Discharge: Condition: Good, Destination: Home . AVS Declined  Performed by:  Leita FORBES Miles, LPN

## 2024-05-23 ENCOUNTER — Ambulatory Visit
